# Patient Record
Sex: Female | Born: 1975 | Race: White | Hispanic: No | Smoking: Never smoker
Health system: Southern US, Community
[De-identification: ages and names within clinical notes are randomized; demographics above are authoritative.]

## PROBLEM LIST (undated history)

## (undated) DIAGNOSIS — E119 Type 2 diabetes mellitus without complications: Secondary | ICD-10-CM

## (undated) DIAGNOSIS — A599 Trichomoniasis, unspecified: Secondary | ICD-10-CM

## (undated) DIAGNOSIS — R87810 Cervical high risk human papillomavirus (HPV) DNA test positive: Secondary | ICD-10-CM

## (undated) DIAGNOSIS — Z9889 Other specified postprocedural states: Secondary | ICD-10-CM

## (undated) HISTORY — PX: CHOLECYSTECTOMY, LAPAROSCOPIC: SHX56

## (undated) HISTORY — DX: Cervical high risk human papillomavirus (HPV) DNA test positive: R87.810

## (undated) HISTORY — DX: Trichomoniasis, unspecified: A59.9

## (undated) HISTORY — DX: Other specified postprocedural states: Z98.890

## (undated) HISTORY — PX: APPENDECTOMY: SHX54

---

## 2012-09-06 ENCOUNTER — Other Ambulatory Visit (HOSPITAL_COMMUNITY)
Admission: RE | Admit: 2012-09-06 | Discharge: 2012-09-06 | Disposition: A | Discharge status: Home / Self Care | Payer: Medicaid Other | Source: Ambulatory Visit | Attending: Obstetrics & Gynecology | Admitting: Obstetrics & Gynecology

## 2012-09-06 ENCOUNTER — Ambulatory Visit (INDEPENDENT_AMBULATORY_CARE_PROVIDER_SITE_OTHER): Payer: Medicaid Other | Admitting: Obstetrics & Gynecology

## 2012-09-06 ENCOUNTER — Other Ambulatory Visit: Payer: Self-pay | Admitting: Obstetrics & Gynecology

## 2012-09-06 ENCOUNTER — Encounter: Payer: Self-pay | Admitting: Obstetrics & Gynecology

## 2012-09-06 VITALS — BP 118/81 | HR 75 | Ht 62.0 in | Wt 203.0 lb

## 2012-09-06 DIAGNOSIS — Z01419 Encounter for gynecological examination (general) (routine) without abnormal findings: Secondary | ICD-10-CM

## 2012-09-06 DIAGNOSIS — N912 Amenorrhea, unspecified: Secondary | ICD-10-CM

## 2012-09-06 DIAGNOSIS — Z124 Encounter for screening for malignant neoplasm of cervix: Secondary | ICD-10-CM

## 2012-09-06 DIAGNOSIS — N760 Acute vaginitis: Secondary | ICD-10-CM | POA: Insufficient documentation

## 2012-09-06 DIAGNOSIS — Z113 Encounter for screening for infections with a predominantly sexual mode of transmission: Secondary | ICD-10-CM | POA: Insufficient documentation

## 2012-09-06 DIAGNOSIS — Z1151 Encounter for screening for human papillomavirus (HPV): Secondary | ICD-10-CM | POA: Insufficient documentation

## 2012-09-06 DIAGNOSIS — Z3049 Encounter for surveillance of other contraceptives: Secondary | ICD-10-CM

## 2012-09-06 DIAGNOSIS — N898 Other specified noninflammatory disorders of vagina: Secondary | ICD-10-CM

## 2012-09-06 DIAGNOSIS — N899 Noninflammatory disorder of vagina, unspecified: Secondary | ICD-10-CM

## 2012-09-06 NOTE — Progress Notes (Signed)
Here for yearly exam, she was diagnosed and treated [facility] the health dept last month for trich.  She is feeling better but still has some irritation and lower pelvic pain, she wants to be sure we know she has family planning medicaid so it will only cover yearly exam.  She has not had a period in 6 months, and it was a year before this last one.  She had norplant years ago and cycles have not been right since.  She has not seen a physician in 9 years.

## 2012-09-06 NOTE — Patient Instructions (Signed)

## 2012-09-06 NOTE — Progress Notes (Signed)
  Subjective:     Cindy Webb is a 36 y.o. female here for a routine exam.  Current complaints: mass in mons, ammenorrhea for 6 months to a year for many years, .  Personal health questionnaire reviewed: yes.   Gynecologic History No LMP recorded. Contraception: condoms Last Pap: ?Marland Kitchen Results were: ?  Obstetric History OB History    Grav Para Term Preterm Abortions TAB SAB Ect Mult Living                   The following portions of the patient's history were reviewed and updated as appropriate: allergies, current medications, past family history, past medical history, past social history, past surgical history and problem list.  Review of Systems A comprehensive review of systems was negative except for: fatigue, abdominal pain, dysuria, vaginal itching, dyspaurenia    Objective:    Vitals:  WNL General appearance: alert, cooperative and no distress Head: Normocephalic, without obvious abnormality, atraumatic Eyes: negative Throat: lips, mucosa, and tongue normal; teeth and gums normal Lungs: clear to auscultation bilaterally Breasts: normal appearance, no masses or tenderness, No nipple retraction or dimpling, No nipple discharge or bleeding Heart: regular rate and rhythm Abdomen: soft, non-tender; bowel sounds normal; no masses,  no organomegaly Pelvic: cervix normal in appearance(bled after pap smear), external genitalia normal, no adnexal masses or tenderness (exam limited by habitus), no bladder tenderness, no cervical motion tenderness, perianal skin: no external genital warts noted, urethra without abnormality or discharge, uterus normal size (limited by habitus), shape, and consistency and vagina normal without discharge, 2 cm by 1 cm mobile mass in mons, skin nml above mass, ? Lymph node. Extremities: no edema, redness or tenderness in the calves or thighs Skin: no lesions or rash Lymph nodes: Axillary adenopathy: none        Assessment:    Healthy female exam.  Mass  in mons Oligomenorrhea   Plan:    Education reviewed: safe sex/STD prevention, self breast exams, skin cancer screening and weight bearing exercise. Contraception: condoms. Follow up in: 3 weeks. Pt needs [place] of mons to eval for mass Pt needs endometrial biopsy due to oligomenorrhea Pt would like to apply for charity care prior to starting treatment TOC today for Cindy Webb (partner never treated).

## 2012-09-12 ENCOUNTER — Telehealth: Payer: Self-pay | Admitting: *Deleted

## 2012-09-12 ENCOUNTER — Encounter: Payer: Self-pay | Admitting: *Deleted

## 2012-09-12 ENCOUNTER — Encounter: Payer: Self-pay | Admitting: Obstetrics & Gynecology

## 2012-09-12 DIAGNOSIS — R87810 Cervical high risk human papillomavirus (HPV) DNA test positive: Secondary | ICD-10-CM

## 2012-09-12 HISTORY — DX: Cervical high risk human papillomavirus (HPV) DNA test positive: R87.810

## 2012-09-12 NOTE — Telephone Encounter (Signed)
Pt notified via mail that her pap was normal but she was positive for HPV and repeat pap in 1 year.  Information about HPV mailed along with the letter.

## 2013-10-17 ENCOUNTER — Other Ambulatory Visit (HOSPITAL_COMMUNITY)
Admission: RE | Admit: 2013-10-17 | Discharge: 2013-10-17 | Disposition: A | Discharge status: Home / Self Care | Payer: Medicaid Other | Source: Ambulatory Visit | Attending: Obstetrics & Gynecology | Admitting: Obstetrics & Gynecology

## 2013-10-17 ENCOUNTER — Ambulatory Visit (INDEPENDENT_AMBULATORY_CARE_PROVIDER_SITE_OTHER): Payer: Medicaid Other | Admitting: Obstetrics & Gynecology

## 2013-10-17 ENCOUNTER — Encounter: Payer: Self-pay | Admitting: Obstetrics & Gynecology

## 2013-10-17 VITALS — BP 131/90 | HR 94 | Resp 16 | Ht 62.0 in | Wt 208.0 lb

## 2013-10-17 DIAGNOSIS — N912 Amenorrhea, unspecified: Secondary | ICD-10-CM

## 2013-10-17 DIAGNOSIS — Z113 Encounter for screening for infections with a predominantly sexual mode of transmission: Secondary | ICD-10-CM | POA: Insufficient documentation

## 2013-10-17 DIAGNOSIS — N760 Acute vaginitis: Secondary | ICD-10-CM | POA: Insufficient documentation

## 2013-10-17 DIAGNOSIS — Z01419 Encounter for gynecological examination (general) (routine) without abnormal findings: Secondary | ICD-10-CM

## 2013-10-17 DIAGNOSIS — Z23 Encounter for immunization: Secondary | ICD-10-CM

## 2013-10-17 LAB — POCT URINE PREGNANCY: Preg Test, Ur: NEGATIVE

## 2013-10-17 NOTE — Progress Notes (Signed)
  Subjective:     Cindy Webb is a 37 y.o. female here for a routine exam.  Current complaints: no menses for at least 7 months.  Did not hear back after turning in her indigentt care paperwork so she did not come back to the MD.  Having unprotected sex.  Has some vaginal burning which she thinks is yeast .  Personal health questionnaire reviewed: yes.   Gynecologic History No LMP recorded. Contraception: none Last Pap: 2013. Results were: cytology nml with +HPV Last mammogram: n/a.   Obstetric History OB History  Gravida Para Term Preterm AB SAB TAB Ectopic Multiple Living  0 0 0 0 0 0 0 0 0 0          The following portions of the patient's history were reviewed and updated as appropriate: allergies, current medications, past family history, past medical history, past social history, past surgical history and problem list.  Review of Systems Pertinent items are noted in HPI.    Objective:      Filed Vitals:   10/17/13 1359  BP: 131/90  Pulse: 94  Resp: 16  Height: 5\' 2"  (1.575 m)  Weight: 208 lb (94.348 kg)   Vitals:  WNL General appearance: alert, cooperative and no distress Head: Normocephalic, without obvious abnormality, atraumatic Eyes: negative Throat: lips, mucosa, and tongue normal; teeth and gums normal Lungs: clear to auscultation bilaterally Breasts: normal appearance, no masses or tenderness, No nipple retraction or dimpling, No nipple discharge or bleeding Heart: regular rate and rhythm Abdomen: soft, non-tender; bowel sounds normal; no masses,  no organomegaly Pelvic: cervix normal in appearance, external genitalia -- has 1 cm mobile mas in mons--no change from last yearl, no adnexal masses or tenderness, no bladder tenderness, no cervical motion tenderness, perianal skin: no external genital warts noted, urethra without abnormality or discharge, uterus non tender but difficult to assess due to habitus, and vagina normal without discharge Extremities: no  edema, redness or tenderness in the calves or thighs Skin: no lesions or rash Lymph nodes: Axillary adenopathy: none         Assessment:  And Plan   Healthy female exam.  Pap with HPV today BD affirm GC/Chlam testing Needs endometrial biopsy due to amenorrhea TSH Prolactin UPT   Plan:

## 2013-10-18 LAB — GC/CHLAMYDIA PROBE AMP
CT Probe RNA: NEGATIVE
GC Probe RNA: NEGATIVE

## 2013-10-18 LAB — TSH: TSH: 2.018 u[IU]/mL (ref 0.350–4.500)

## 2013-10-22 ENCOUNTER — Encounter: Payer: Self-pay | Admitting: Obstetrics & Gynecology

## 2013-10-22 ENCOUNTER — Telehealth: Payer: Self-pay | Admitting: *Deleted

## 2013-10-22 NOTE — Telephone Encounter (Signed)
Called pt to adv needs Colpo due to positive HPV - she will make appt to come in for that.

## 2013-10-23 ENCOUNTER — Telehealth: Payer: Self-pay | Admitting: *Deleted

## 2013-10-23 NOTE — Telephone Encounter (Signed)
Copy of normal labs mailed to pt's home address per pt's request. 

## 2017-07-25 ENCOUNTER — Other Ambulatory Visit (HOSPITAL_COMMUNITY)
Admission: RE | Admit: 2017-07-25 | Discharge: 2017-07-25 | Disposition: A | Discharge status: Home / Self Care | Payer: Medicaid Other | Source: Ambulatory Visit | Attending: Family Medicine | Admitting: Family Medicine

## 2017-07-25 ENCOUNTER — Encounter: Payer: Self-pay | Admitting: Family Medicine

## 2017-07-25 ENCOUNTER — Ambulatory Visit (INDEPENDENT_AMBULATORY_CARE_PROVIDER_SITE_OTHER): Payer: Medicaid Other | Admitting: Family Medicine

## 2017-07-25 VITALS — BP 127/77 | HR 72 | Ht 62.0 in | Wt 196.0 lb

## 2017-07-25 DIAGNOSIS — N939 Abnormal uterine and vaginal bleeding, unspecified: Secondary | ICD-10-CM

## 2017-07-25 DIAGNOSIS — Z3042 Encounter for surveillance of injectable contraceptive: Secondary | ICD-10-CM

## 2017-07-25 DIAGNOSIS — Z113 Encounter for screening for infections with a predominantly sexual mode of transmission: Secondary | ICD-10-CM | POA: Insufficient documentation

## 2017-07-25 DIAGNOSIS — N760 Acute vaginitis: Secondary | ICD-10-CM | POA: Insufficient documentation

## 2017-07-25 DIAGNOSIS — R87610 Atypical squamous cells of undetermined significance on cytologic smear of cervix (ASC-US): Secondary | ICD-10-CM | POA: Diagnosis not present

## 2017-07-25 DIAGNOSIS — B9689 Other specified bacterial agents as the cause of diseases classified elsewhere: Secondary | ICD-10-CM | POA: Insufficient documentation

## 2017-07-25 DIAGNOSIS — Z1239 Encounter for other screening for malignant neoplasm of breast: Secondary | ICD-10-CM

## 2017-07-25 MED ORDER — MEDROXYPROGESTERONE ACETATE 150 MG/ML IM SUSP: 150.0000 mg | INTRAMUSCULAR | 3 refills | Status: DC

## 2017-07-25 NOTE — Progress Notes (Signed)
   Subjective:    Patient ID: Cindy Webb is a 41 y.o. female presenting with Discuss Menstrual Cycle  on 07/25/2017  HPI: Reports 2 wks of bleeding and has been spotting. Is not heavy. chcles have been off. Had a child 1 year ago and bled a lot. Gall bladder surgery on 7/16 and bleeding since then. Bleeding is worse with intercourse. Has also had some thin watery discharge. Last period before that was April. On Depo Provera, last given 05/10/17. Lump on mons which is not bothering her.  Review of Systems  Constitutional: Negative for chills and fever.  HENT: Negative for congestion, nosebleeds and rhinorrhea.   Eyes: Negative for visual disturbance.  Respiratory: Negative for chest tightness and shortness of breath.   Cardiovascular: Negative for chest pain.  Gastrointestinal: Negative for abdominal distention, abdominal pain, blood in stool, constipation, diarrhea, nausea and vomiting.  Genitourinary: Positive for menstrual problem. Negative for dysuria, frequency, pelvic pain and vaginal bleeding.  Musculoskeletal: Negative for arthralgias.  Skin: Negative for rash.  Neurological: Negative for dizziness and headaches.  Psychiatric/Behavioral: Negative for behavioral problems, dysphoric mood and sleep disturbance.  All other systems reviewed and are negative.     Objective:    BP 127/77   Pulse 72   Ht 5\' 2"  (1.575 m)   Wt 196 lb (88.9 kg)   BMI 35.85 kg/m  Physical Exam  Constitutional: She is oriented to person, place, and time. She appears well-developed and well-nourished. No distress.  HENT:  Head: Normocephalic and atraumatic.  Eyes: Pupils are equal, round, and reactive to light. No scleral icterus.  Neck: Normal range of motion. Neck supple. No thyromegaly present.  Cardiovascular: Normal rate, regular rhythm and intact distal pulses.   Pulmonary/Chest: Effort normal and breath sounds normal. Right breast exhibits no inverted nipple, no mass and no skin change. Left  breast exhibits no inverted nipple, no mass and no skin change. Breasts are symmetrical.  Abdominal: Soft. She exhibits no distension. There is no tenderness.  Genitourinary:  Genitourinary Comments: BUS normal, vagina is pink and ruggated, dark blood noted with foul-smelling odor, cervix is nulliparous without lesion, uterus is small and anteverted, no adnexal mass or tenderness.   Neurological: She is alert and oriented to person, place, and time.  Skin: Skin is warm and dry.  Psychiatric: She has a normal mood and affect.        Assessment & Plan:   Problem List Items Addressed This Visit      Unprioritized   Abnormal uterine bleeding - Primary    Likely related to Depo--check cultures and treat appropriately. Check TSH. If no better after next Depo--consider changing contraceptives. Patinet is not interested in having more kids.      Relevant Orders   Cervicovaginal ancillary only   TSH    Other Visit Diagnoses    Screen for STD (sexually transmitted disease)       Relevant Orders   Cytology - PAP   Screening for breast cancer       Relevant Orders   MM DIGITAL SCREENING BILATERAL   Encounter for surveillance of injectable contraceptive       Relevant Medications   medroxyPROGESTERone (DEPO-PROVERA) 150 MG/ML injection      Total face-to-face time with patient: 30 minutes. Over 50% of encounter was spent on counseling and coordination of care. Return in about 3 months (around 10/25/2017) for a follow-up.  Reva Boresanya S Elea Holtzclaw 07/25/2017 1:01 PM

## 2017-07-25 NOTE — Assessment & Plan Note (Signed)
Likely related to Depo--check cultures and treat appropriately. Check TSH. If no better after next Depo--consider changing contraceptives. Patinet is not interested in having more kids.

## 2017-07-25 NOTE — Patient Instructions (Signed)
Preventive Care 18-39 Years, Female Preventive care refers to lifestyle choices and visits with your health care provider that can promote health and wellness. What does preventive care include?  A yearly physical exam. This is also called an annual well check.  Dental exams once or twice a year.  Routine eye exams. Ask your health care provider how often you should have your eyes checked.  Personal lifestyle choices, including: ? Daily care of your teeth and gums. ? Regular physical activity. ? Eating a healthy diet. ? Avoiding tobacco and drug use. ? Limiting alcohol use. ? Practicing safe sex. ? Taking vitamin and mineral supplements as recommended by your health care provider. What happens during an annual well check? The services and screenings done by your health care provider during your annual well check will depend on your age, overall health, lifestyle risk factors, and family history of disease. Counseling Your health care provider may ask you questions about your:  Alcohol use.  Tobacco use.  Drug use.  Emotional well-being.  Home and relationship well-being.  Sexual activity.  Eating habits.  Work and work Statistician.  Method of birth control.  Menstrual cycle.  Pregnancy history.  Screening You may have the following tests or measurements:  Height, weight, and BMI.  Diabetes screening. This is done by checking your blood sugar (glucose) after you have not eaten for a while (fasting).  Blood pressure.  Lipid and cholesterol levels. These may be checked every 5 years starting at age 66.  Skin check.  Hepatitis C blood test.  Hepatitis B blood test.  Sexually transmitted disease (STD) testing.  BRCA-related cancer screening. This may be done if you have a family history of breast, ovarian, tubal, or peritoneal cancers.  Pelvic exam and Pap test. This may be done every 3 years starting at age 40. Starting at age 59, this may be done every 5  years if you have a Pap test in combination with an HPV test.  Discuss your test results, treatment options, and if necessary, the need for more tests with your health care provider. Vaccines Your health care provider may recommend certain vaccines, such as:  Influenza vaccine. This is recommended every year.  Tetanus, diphtheria, and acellular pertussis (Tdap, Td) vaccine. You may need a Td booster every 10 years.  Varicella vaccine. You may need this if you have not been vaccinated.  HPV vaccine. If you are 69 or younger, you may need three doses over 6 months.  Measles, mumps, and rubella (MMR) vaccine. You may need at least one dose of MMR. You may also need a second dose.  Pneumococcal 13-valent conjugate (PCV13) vaccine. You may need this if you have certain conditions and were not previously vaccinated.  Pneumococcal polysaccharide (PPSV23) vaccine. You may need one or two doses if you smoke cigarettes or if you have certain conditions.  Meningococcal vaccine. One dose is recommended if you are age 27-21 years and a first-year college student living in a residence hall, or if you have one of several medical conditions. You may also need additional booster doses.  Hepatitis A vaccine. You may need this if you have certain conditions or if you travel or work in places where you may be exposed to hepatitis A.  Hepatitis B vaccine. You may need this if you have certain conditions or if you travel or work in places where you may be exposed to hepatitis B.  Haemophilus influenzae type b (Hib) vaccine. You may need this if  you have certain risk factors.  Talk to your health care provider about which screenings and vaccines you need and how often you need them. This information is not intended to replace advice given to you by your health care provider. Make sure you discuss any questions you have with your health care provider. Document Released: 01/31/2002 Document Revised: 08/24/2016  Document Reviewed: 10/06/2015 Elsevier Interactive Patient Education  2017 [employer].

## 2017-07-26 LAB — TSH: TSH: 1.38 m[IU]/L

## 2017-07-27 LAB — CERVICOVAGINAL ANCILLARY ONLY
Bacterial vaginitis: POSITIVE — AB
Candida vaginitis: NEGATIVE
Chlamydia: NEGATIVE
Neisseria Gonorrhea: NEGATIVE
Trichomonas: NEGATIVE

## 2017-07-28 ENCOUNTER — Telehealth: Payer: Self-pay

## 2017-07-28 MED ORDER — METRONIDAZOLE 500 MG PO TABS: 500.0000 mg | ORAL_TABLET | Freq: Two times a day (BID) | ORAL | 0 refills | Status: DC

## 2017-07-28 NOTE — Telephone Encounter (Signed)
-----   Message from Reva Boresanya S Pratt, MD sent at 07/28/2017 10:17 AM EDT ----- Please treat for BV--flagyl 500 mg po bid x 7 d

## 2017-07-28 NOTE — Telephone Encounter (Signed)
Patient called and made aware that she has bacterial vaginosis. Patient made aware that she will need to take antibiotic twice a day for seven days. Armandina StammerJennifer Stuart Mirabile RNBSN

## 2017-07-31 LAB — CYTOLOGY - PAP
Diagnosis: UNDETERMINED — AB
HPV 16/18/45 GENOTYPING: NEGATIVE
HPV: DETECTED — AB

## 2017-08-02 ENCOUNTER — Telehealth: Payer: Self-pay | Admitting: *Deleted

## 2017-08-02 ENCOUNTER — Ambulatory Visit (INDEPENDENT_AMBULATORY_CARE_PROVIDER_SITE_OTHER): Payer: Medicaid Other

## 2017-08-02 DIAGNOSIS — Z1231 Encounter for screening mammogram for malignant neoplasm of breast: Secondary | ICD-10-CM

## 2017-08-02 DIAGNOSIS — Z1239 Encounter for other screening for malignant neoplasm of breast: Secondary | ICD-10-CM

## 2017-08-02 NOTE — Telephone Encounter (Signed)
-----   Message from Reva Boresanya S Pratt, MD sent at 08/01/2017  9:49 AM EDT ----- Abnormal pap, needs colpo

## 2017-08-02 NOTE — Telephone Encounter (Signed)
Pt aware of pap results and needs to schedule a colposcopy.  She is currently bleeding and will give us an update next week when she comes for her Depo Provera injection.

## 2017-08-10 ENCOUNTER — Ambulatory Visit (INDEPENDENT_AMBULATORY_CARE_PROVIDER_SITE_OTHER): Payer: Medicaid Other | Admitting: *Deleted

## 2017-08-10 DIAGNOSIS — Z3042 Encounter for surveillance of injectable contraceptive: Secondary | ICD-10-CM | POA: Diagnosis not present

## 2017-08-10 DIAGNOSIS — Z30019 Encounter for initial prescription of contraceptives, unspecified: Secondary | ICD-10-CM

## 2017-08-10 MED ORDER — MEDROXYPROGESTERONE ACETATE 150 MG/ML IM SUSP
150.0000 mg | INTRAMUSCULAR | Status: AC
  Administered 2017-08-10: 150 mg via INTRAMUSCULAR

## 2017-08-10 NOTE — Progress Notes (Signed)
Pt here for Depo Provera injection.  She states that she is still having some bleeding most days since her Gallbladder surgery 07/03/17.  She needs a colposcopy but is bleeding.  She will call me in 1 week with update on bleeding to schedule her Colpo.  If still bleeding will make appt with provider.

## 2017-08-29 ENCOUNTER — Encounter: Payer: Self-pay | Admitting: Obstetrics and Gynecology

## 2017-08-29 ENCOUNTER — Ambulatory Visit (INDEPENDENT_AMBULATORY_CARE_PROVIDER_SITE_OTHER): Payer: Medicaid Other | Admitting: Obstetrics and Gynecology

## 2017-08-29 ENCOUNTER — Other Ambulatory Visit (HOSPITAL_COMMUNITY)
Admission: RE | Admit: 2017-08-29 | Discharge: 2017-08-29 | Disposition: A | Discharge status: Home / Self Care | Payer: Medicaid Other | Source: Ambulatory Visit | Attending: Obstetrics and Gynecology | Admitting: Obstetrics and Gynecology

## 2017-08-29 VITALS — BP 135/88 | HR 86 | Ht 62.0 in | Wt 197.0 lb

## 2017-08-29 DIAGNOSIS — R87610 Atypical squamous cells of undetermined significance on cytologic smear of cervix (ASC-US): Secondary | ICD-10-CM

## 2017-08-29 DIAGNOSIS — Z01812 Encounter for preprocedural laboratory examination: Secondary | ICD-10-CM

## 2017-08-29 LAB — POCT URINE PREGNANCY: PREG TEST UR: NEGATIVE

## 2017-08-29 NOTE — Progress Notes (Signed)
41 yo here for colposcopy. 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  Placed in lithotomy position. Cervix viewed with speculum and colposcope after application of acetic acid.   Colposcopy adequate?  Yes   Acetowhite lesions? Yes at 1 an 8 o'clock Punctation? no Mosaicism?  no Abnormal vasculature?  no Biopsies? Yes at 1 an 8 o'clock ECC? yes  COMMENTS:  Patient was given post procedure instructions.  She will return in 2 weeks for results.  Catalina AntiguaPeggy Atari Novick, MD

## 2017-09-04 ENCOUNTER — Telehealth: Payer: Self-pay | Admitting: *Deleted

## 2017-09-04 NOTE — Telephone Encounter (Signed)
Pt notified of colpo results and she is to return in 1 year for a pap smear.

## 2017-09-04 NOTE — Telephone Encounter (Signed)
-----   Message from Catalina Antigua, MD sent at 09/02/2017  9:16 AM EDT ----- Please inform patient of colpo biopsies consistent with pap smear. Plan is to repeat pap smear in 1 year. In the meantime, lifestyle changes to help improve immune system may be beneficial such as exercising regularly, eating healthy fruits and vegetables, sleeping at least 7 hours, minimize stress and taking a B complex supplement  Thanks  [place]

## 2017-11-02 ENCOUNTER — Ambulatory Visit (INDEPENDENT_AMBULATORY_CARE_PROVIDER_SITE_OTHER): Payer: Medicaid Other

## 2017-11-02 DIAGNOSIS — Z3042 Encounter for surveillance of injectable contraceptive: Secondary | ICD-10-CM

## 2017-11-02 DIAGNOSIS — Z23 Encounter for immunization: Secondary | ICD-10-CM | POA: Diagnosis not present

## 2017-11-02 DIAGNOSIS — Z309 Encounter for contraceptive management, unspecified: Secondary | ICD-10-CM

## 2017-11-02 MED ORDER — MEDROXYPROGESTERONE ACETATE 150 MG/ML IM SUSP
150.0000 mg | Freq: Once | INTRAMUSCULAR | Status: AC
  Administered 2017-11-02: 150 mg via INTRAMUSCULAR

## 2018-01-11 ENCOUNTER — Telehealth: Payer: Self-pay

## 2018-01-11 DIAGNOSIS — Z3042 Encounter for surveillance of injectable contraceptive: Secondary | ICD-10-CM

## 2018-01-11 MED ORDER — MEDROXYPROGESTERONE ACETATE 150 MG/ML IM SUSP: 150.0000 mg | INTRAMUSCULAR | 3 refills | Status: DC

## 2018-01-11 NOTE — Telephone Encounter (Signed)
Pt called needing a refill on her Depo. Refilled and sent to pharmacy.

## 2018-01-24 ENCOUNTER — Ambulatory Visit (INDEPENDENT_AMBULATORY_CARE_PROVIDER_SITE_OTHER): Payer: Medicaid Other

## 2018-01-24 DIAGNOSIS — Z3042 Encounter for surveillance of injectable contraceptive: Secondary | ICD-10-CM

## 2018-01-24 MED ORDER — MEDROXYPROGESTERONE ACETATE 150 MG/ML IM SUSP
150.0000 mg | Freq: Once | INTRAMUSCULAR | Status: AC
  Administered 2018-01-24: 150 mg via INTRAMUSCULAR

## 2018-04-23 ENCOUNTER — Other Ambulatory Visit: Payer: Medicaid Other

## 2018-04-23 DIAGNOSIS — Z3042 Encounter for surveillance of injectable contraceptive: Secondary | ICD-10-CM | POA: Diagnosis not present

## 2018-04-23 DIAGNOSIS — IMO0001 Reserved for inherently not codable concepts without codable children: Secondary | ICD-10-CM

## 2018-04-23 MED ORDER — MEDROXYPROGESTERONE ACETATE 150 MG/ML IM SUSP
150.0000 mg | Freq: Once | INTRAMUSCULAR | Status: AC
  Administered 2018-04-23: 150 mg via INTRAMUSCULAR

## 2018-07-24 ENCOUNTER — Ambulatory Visit: Payer: Medicaid Other

## 2018-07-24 DIAGNOSIS — IMO0001 Reserved for inherently not codable concepts without codable children: Secondary | ICD-10-CM

## 2018-07-24 DIAGNOSIS — Z3042 Encounter for surveillance of injectable contraceptive: Secondary | ICD-10-CM

## 2018-07-24 MED ORDER — MEDROXYPROGESTERONE ACETATE 150 MG/ML IM SUSP
150.0000 mg | Freq: Once | INTRAMUSCULAR | Status: AC
  Administered 2018-07-24: 150 mg via INTRAMUSCULAR

## 2018-09-26 ENCOUNTER — Encounter: Payer: Self-pay | Admitting: Obstetrics and Gynecology

## 2018-09-26 ENCOUNTER — Ambulatory Visit: Payer: Medicaid Other | Admitting: Obstetrics and Gynecology

## 2018-09-26 ENCOUNTER — Ambulatory Visit (INDEPENDENT_AMBULATORY_CARE_PROVIDER_SITE_OTHER): Payer: Medicaid Other

## 2018-09-26 ENCOUNTER — Other Ambulatory Visit (HOSPITAL_COMMUNITY)
Admission: RE | Admit: 2018-09-26 | Discharge: 2018-09-26 | Disposition: A | Discharge status: Home / Self Care | Payer: Medicaid Other | Source: Ambulatory Visit | Attending: Obstetrics and Gynecology | Admitting: Obstetrics and Gynecology

## 2018-09-26 ENCOUNTER — Telehealth: Payer: Self-pay

## 2018-09-26 VITALS — BP 116/80 | HR 73 | Ht 62.0 in | Wt 188.0 lb

## 2018-09-26 DIAGNOSIS — Z124 Encounter for screening for malignant neoplasm of cervix: Secondary | ICD-10-CM | POA: Insufficient documentation

## 2018-09-26 DIAGNOSIS — N938 Other specified abnormal uterine and vaginal bleeding: Secondary | ICD-10-CM | POA: Diagnosis present

## 2018-09-26 MED ORDER — MEGESTROL ACETATE 40 MG PO TABS: 40.0000 mg | ORAL_TABLET | Freq: Two times a day (BID) | ORAL | 5 refills | Status: DC

## 2018-09-26 NOTE — Patient Instructions (Signed)

## 2018-09-26 NOTE — Telephone Encounter (Addendum)
Spoke with pt and she is aware of U/S results and to keep scheduled appt.  ----- Message from Catalina Antigua, MD sent at 09/26/2018  2:11 PM EDT ----- Please inform patient of normal ultrasound without fibroid or ovarian cyst. She should keep her appointment as scheduled to discuss results of endometrial biopsy and further management options

## 2018-09-26 NOTE — Progress Notes (Signed)
42 yo P1 with BMI 34 here for the evaluation of vaginal bleeding for the past 3 weeks. Patient is currently on depo-provera for contraception and cycle control. Patient reports a monthly period lasting 8-9 days typically but never 3 weeks long. She reports some dysmenorrhea. Patient also reports persistent vaginal irritation/pruritis despite several treatment for vaginal yeast infection. She was recently treated for a UTI. Patient reports normal mammogram this year and is due for a pap smear.  Past Medical History:  Diagnosis Date  . H/O colposcopy with cervical biopsy   . Hypertension    pregnancy infection  . Trichomonal infection    Past Surgical History:  Procedure Laterality Date  . APPENDECTOMY    . CESAREAN SECTION    . CHOLECYSTECTOMY, LAPAROSCOPIC     Family History  Problem Relation Age of Onset  . Diabetes Mother   . Diabetes Maternal Aunt    Social History   Tobacco Use  . Smoking status: Never Smoker  . Smokeless tobacco: Never Used  Substance Use Topics  . Alcohol use: No  . Drug use: No   ROS See pertinent in HPI Blood pressure 116/80, pulse 73, height 5\' 2"  (1.575 m), weight 188 lb (85.3 kg), last menstrual period 09/12/2018.  GENERAL: Well-developed, well-nourished female in no acute distress.  HEENT: Normocephalic, atraumatic. Sclerae anicteric.  NECK: Supple. Normal thyroid.  LUNGS: Clear to auscultation bilaterally.  HEART: Regular rate and rhythm. BREASTS: Symmetric in size. No palpable masses or lymphadenopathy, skin changes, or nipple drainage. ABDOMEN: Soft, nontender, nondistended. No organomegaly. PELVIC: Normal external female genitalia. Vagina is pink and rugated.  Normal discharge. Normal appearing cervix. Uterus is normal in size. No adnexal mass or tenderness. EXTREMITIES: No cyanosis, clubbing, or edema, 2+ distal pulses.  A/P 42 yo with DUB - pap smear collected  - pelvic ultrasound ordered - rx megace provided to assist with current  DUB - Discussed Mirena IUD as a possible treatment options - endometrial biopsy performed ENDOMETRIAL BIOPSY     The indications for endometrial biopsy were reviewed.   Risks of the biopsy including cramping, bleeding, infection, uterine perforation, inadequate specimen and need for additional procedures  were discussed. The patient states she understands and agrees to undergo procedure today. Consent was signed. Time out was performed. Urine HCG was negative. A sterile speculum was placed in the patient's vagina and the cervix was prepped with Betadine. A single-toothed tenaculum was placed on the anterior lip of the cervix to stabilize it. The uterine cavity was sounded to a depth of 8 cm using the uterine sound. The 3 mm pipelle was introduced into the endometrial cavity without difficulty, 2 passes were made.  A  moderate amount of tissue was  sent to pathology. The instruments were removed from the patient's vagina. Minimal bleeding from the cervix was noted. The patient tolerated the procedure well.  Routine post-procedure instructions were given to the patient. The patient will follow up in two weeks to review the results and for further management.

## 2018-09-27 ENCOUNTER — Telehealth: Payer: Self-pay | Admitting: *Deleted

## 2018-09-27 NOTE — Telephone Encounter (Signed)
-----   Message from Catalina Antigua, MD sent at 09/26/2018  2:11 PM EDT ----- Please inform patient of normal ultrasound without fibroid or ovarian cyst. She should keep her appointment as scheduled to discuss results of endometrial biopsy and further management options

## 2018-09-27 NOTE — Telephone Encounter (Signed)
LM on cell phone that her U/S was normal and that she needed to keep her appt to discuss further treatment.

## 2018-10-02 ENCOUNTER — Telehealth: Payer: Self-pay | Admitting: *Deleted

## 2018-10-02 LAB — CYTOLOGY - PAP
Bacterial vaginitis: POSITIVE — AB
Candida vaginitis: NEGATIVE
DIAGNOSIS: NEGATIVE
DIAGNOSIS: REACTIVE
HPV 16/18/45 GENOTYPING: NEGATIVE
HPV: DETECTED — AB

## 2018-10-02 MED ORDER — METRONIDAZOLE 500 MG PO TABS: 500.0000 mg | ORAL_TABLET | Freq: Two times a day (BID) | ORAL | 0 refills | Status: DC

## 2018-10-02 NOTE — Telephone Encounter (Signed)
-----   Message from Catalina Antigua, MD sent at 10/02/2018  3:48 PM EDT ----- Please inform patient of BV infection. Rx has been e-prescribed.

## 2018-10-02 NOTE — Addendum Note (Signed)
Addended by: Catalina Antigua on: 10/02/2018 03:49 PM   Modules accepted: Orders

## 2018-10-02 NOTE — Telephone Encounter (Signed)
Pt notified that she is positive for BV and Dr Jolayne Panther sent a RX into [facility] for her.

## 2018-10-15 ENCOUNTER — Ambulatory Visit: Payer: Medicaid Other | Admitting: Obstetrics & Gynecology

## 2018-10-15 ENCOUNTER — Encounter (HOSPITAL_COMMUNITY): Payer: Self-pay

## 2018-10-15 ENCOUNTER — Encounter: Payer: Self-pay | Admitting: Obstetrics & Gynecology

## 2018-10-15 VITALS — BP 123/89 | HR 82 | Ht 62.0 in | Wt 187.0 lb

## 2018-10-15 DIAGNOSIS — Z23 Encounter for immunization: Secondary | ICD-10-CM | POA: Diagnosis not present

## 2018-10-15 DIAGNOSIS — N938 Other specified abnormal uterine and vaginal bleeding: Secondary | ICD-10-CM

## 2018-10-15 DIAGNOSIS — Z3042 Encounter for surveillance of injectable contraceptive: Secondary | ICD-10-CM

## 2018-10-15 MED ORDER — MEDROXYPROGESTERONE ACETATE 150 MG/ML IM SUSP: 150.0000 mg | INTRAMUSCULAR | 3 refills | Status: DC

## 2018-10-15 NOTE — Progress Notes (Signed)
   Subjective:    Patient ID: Cindy Webb, female    DOB: [birth year], 42 y.o.   MRN: [id number]  HPI 42 yo single P26 (2 yo daughter) here today to follow up after u/s and EMBX for DUB on depo provera. All the tests were normal. She has tried Norplant, OCPs in the past and still had irregular bleeding. The bleeding got better with the megace. She likes the depo provera the best of all the options she has tried. She would like a BTL as she is certain that she does not want more kids.  She also reports dysparueunia for about 6-7 months. This is like "cramp" pain, with deep thrusting, no problems with lubrications.   Review of Systems Monogamous for 14 years.    Objective:   Physical Exam Breathing, conversing, and ambulating normally Well nourished, well hydrated White female, no apparent distress Abd- benign     Assessment & Plan:  Preventative care- TDAP and flu vaccines Contraception- plan for BTL. Will sign MCD forms today Will get next depo provera shot next month Check TSH today

## 2018-10-16 LAB — TSH: TSH: 1.96 m[IU]/L

## 2018-10-24 ENCOUNTER — Ambulatory Visit: Payer: Medicaid Other

## 2018-10-24 DIAGNOSIS — Z3042 Encounter for surveillance of injectable contraceptive: Secondary | ICD-10-CM

## 2018-10-24 MED ORDER — MEDROXYPROGESTERONE ACETATE 150 MG/ML IM SUSP
150.0000 mg | Freq: Once | INTRAMUSCULAR | Status: AC
  Administered 2018-10-24: 150 mg via INTRAMUSCULAR

## 2018-10-24 NOTE — Progress Notes (Signed)
Pt here for Depo- Provera injection. Pt provided meds. Pt tolerated injection well.

## 2018-11-28 ENCOUNTER — Encounter (HOSPITAL_BASED_OUTPATIENT_CLINIC_OR_DEPARTMENT_OTHER): Payer: Self-pay | Admitting: *Deleted

## 2018-11-28 ENCOUNTER — Other Ambulatory Visit: Payer: Self-pay

## 2018-12-04 ENCOUNTER — Encounter (HOSPITAL_BASED_OUTPATIENT_CLINIC_OR_DEPARTMENT_OTHER)
Admission: RE | Admit: 2018-12-04 | Discharge: 2018-12-04 | Disposition: A | Discharge status: Home / Self Care | Payer: Medicaid Other | Source: Ambulatory Visit | Attending: Obstetrics & Gynecology | Admitting: Obstetrics & Gynecology

## 2018-12-04 DIAGNOSIS — E119 Type 2 diabetes mellitus without complications: Secondary | ICD-10-CM | POA: Diagnosis not present

## 2018-12-04 DIAGNOSIS — Z6835 Body mass index (BMI) 35.0-35.9, adult: Secondary | ICD-10-CM | POA: Diagnosis not present

## 2018-12-04 DIAGNOSIS — Z01818 Encounter for other preprocedural examination: Secondary | ICD-10-CM | POA: Insufficient documentation

## 2018-12-04 DIAGNOSIS — Z79899 Other long term (current) drug therapy: Secondary | ICD-10-CM | POA: Diagnosis not present

## 2018-12-04 DIAGNOSIS — Z7984 Long term (current) use of oral hypoglycemic drugs: Secondary | ICD-10-CM | POA: Diagnosis not present

## 2018-12-04 DIAGNOSIS — Z302 Encounter for sterilization: Secondary | ICD-10-CM | POA: Diagnosis present

## 2018-12-04 LAB — BASIC METABOLIC PANEL
Anion gap: 11 (ref 5–15)
BUN: 10 mg/dL (ref 6–20)
CALCIUM: 9.1 mg/dL (ref 8.9–10.3)
CO2: 21 mmol/L — AB (ref 22–32)
Chloride: 106 mmol/L (ref 98–111)
Creatinine, Ser: 0.69 mg/dL (ref 0.44–1.00)
GFR calc Af Amer: >60 mL/min (ref >60)
GLUCOSE: 211 mg/dL — AB (ref 70–99)
Potassium: 4 mmol/L (ref 3.5–5.1)
Sodium: 138 mmol/L (ref 135–145)

## 2018-12-04 LAB — POCT PREGNANCY, URINE: Preg Test, Ur: NEGATIVE

## 2018-12-04 NOTE — Progress Notes (Signed)
Ensure pre surgery drink given with instructions to complete by 0915 dos, pt verbalized understanding. 

## 2018-12-05 ENCOUNTER — Ambulatory Visit (HOSPITAL_BASED_OUTPATIENT_CLINIC_OR_DEPARTMENT_OTHER): Payer: Medicaid Other | Admitting: Anesthesiology

## 2018-12-05 ENCOUNTER — Ambulatory Visit (HOSPITAL_BASED_OUTPATIENT_CLINIC_OR_DEPARTMENT_OTHER)
Admission: RE | Admit: 2018-12-05 | Discharge: 2018-12-05 | Disposition: A | Discharge status: Home / Self Care | Payer: Medicaid Other | Attending: Obstetrics & Gynecology | Admitting: Obstetrics & Gynecology

## 2018-12-05 ENCOUNTER — Encounter (HOSPITAL_COMMUNITY): Payer: Self-pay

## 2018-12-05 ENCOUNTER — Encounter (HOSPITAL_BASED_OUTPATIENT_CLINIC_OR_DEPARTMENT_OTHER): Payer: Self-pay | Admitting: *Deleted

## 2018-12-05 ENCOUNTER — Other Ambulatory Visit: Payer: Self-pay

## 2018-12-05 ENCOUNTER — Encounter (HOSPITAL_BASED_OUTPATIENT_CLINIC_OR_DEPARTMENT_OTHER)
Admission: RE | Disposition: A | Discharge status: Home / Self Care | Payer: Self-pay | Source: Home / Self Care | Attending: Obstetrics & Gynecology

## 2018-12-05 DIAGNOSIS — E119 Type 2 diabetes mellitus without complications: Secondary | ICD-10-CM | POA: Diagnosis not present

## 2018-12-05 DIAGNOSIS — Z7984 Long term (current) use of oral hypoglycemic drugs: Secondary | ICD-10-CM | POA: Diagnosis not present

## 2018-12-05 DIAGNOSIS — Z79899 Other long term (current) drug therapy: Secondary | ICD-10-CM | POA: Diagnosis not present

## 2018-12-05 DIAGNOSIS — Z302 Encounter for sterilization: Secondary | ICD-10-CM | POA: Diagnosis not present

## 2018-12-05 DIAGNOSIS — Z6835 Body mass index (BMI) 35.0-35.9, adult: Secondary | ICD-10-CM | POA: Insufficient documentation

## 2018-12-05 HISTORY — PX: LAPAROSCOPIC TUBAL LIGATION: SHX1937

## 2018-12-05 HISTORY — DX: Type 2 diabetes mellitus without complications: E11.9

## 2018-12-05 LAB — GLUCOSE, CAPILLARY
GLUCOSE-CAPILLARY: 98 mg/dL (ref 70–99)
Glucose-Capillary: 127 mg/dL — ABNORMAL HIGH (ref 70–99)

## 2018-12-05 SURGERY — LIGATION, FALLOPIAN TUBE, LAPAROSCOPIC
Anesthesia: General | Site: Abdomen | Laterality: Bilateral

## 2018-12-05 MED ORDER — SUGAMMADEX SODIUM 500 MG/5ML IV SOLN
INTRAVENOUS | Status: DC | PRN
  Administered 2018-12-05: 500 mg via INTRAVENOUS

## 2018-12-05 MED ORDER — FENTANYL CITRATE (PF) 100 MCG/2ML IJ SOLN
25.0000 ug | INTRAMUSCULAR | Status: DC | PRN
  Administered 2018-12-05 (×2): 50 ug via INTRAVENOUS

## 2018-12-05 MED ORDER — FENTANYL CITRATE (PF) 100 MCG/2ML IJ SOLN
50.0000 ug | INTRAMUSCULAR | Status: DC | PRN
  Administered 2018-12-05: 100 ug via INTRAVENOUS

## 2018-12-05 MED ORDER — DEXAMETHASONE SODIUM PHOSPHATE 10 MG/ML IJ SOLN
INTRAMUSCULAR | Status: AC
  Filled 2018-12-05: qty 1

## 2018-12-05 MED ORDER — PROPOFOL 10 MG/ML IV BOLUS
INTRAVENOUS | Status: DC | PRN
  Administered 2018-12-05: 200 mg via INTRAVENOUS

## 2018-12-05 MED ORDER — LACTATED RINGERS IV SOLN
INTRAVENOUS | Status: DC
  Administered 2018-12-05 (×2): via INTRAVENOUS

## 2018-12-05 MED ORDER — KETOROLAC TROMETHAMINE 30 MG/ML IJ SOLN
INTRAMUSCULAR | Status: DC | PRN
  Administered 2018-12-05: 30 mg via INTRAVENOUS

## 2018-12-05 MED ORDER — FENTANYL CITRATE (PF) 100 MCG/2ML IJ SOLN
INTRAMUSCULAR | Status: AC
  Filled 2018-12-05: qty 2

## 2018-12-05 MED ORDER — ROCURONIUM BROMIDE 50 MG/5ML IV SOSY
PREFILLED_SYRINGE | INTRAVENOUS | Status: AC
  Filled 2018-12-05: qty 10

## 2018-12-05 MED ORDER — IBUPROFEN 600 MG PO TABS: 600.0000 mg | ORAL_TABLET | Freq: Four times a day (QID) | ORAL | 1 refills | Status: AC | PRN

## 2018-12-05 MED ORDER — SCOPOLAMINE 1 MG/3DAYS TD PT72
MEDICATED_PATCH | TRANSDERMAL | Status: AC
  Filled 2018-12-05: qty 1

## 2018-12-05 MED ORDER — SCOPOLAMINE 1 MG/3DAYS TD PT72
1.0000 | MEDICATED_PATCH | Freq: Once | TRANSDERMAL | Status: DC | PRN
  Administered 2018-12-05: 1.5 mg via TRANSDERMAL

## 2018-12-05 MED ORDER — SUGAMMADEX SODIUM 500 MG/5ML IV SOLN
INTRAVENOUS | Status: AC
  Filled 2018-12-05: qty 5

## 2018-12-05 MED ORDER — OXYCODONE-ACETAMINOPHEN 5-325 MG PO TABS: 1.0000 | ORAL_TABLET | Freq: Four times a day (QID) | ORAL | 0 refills | Status: DC | PRN

## 2018-12-05 MED ORDER — KETOROLAC TROMETHAMINE 60 MG/2ML IM SOLN
INTRAMUSCULAR | Status: DC | PRN
  Administered 2018-12-05: 30 mg via INTRAMUSCULAR

## 2018-12-05 MED ORDER — DEXAMETHASONE SODIUM PHOSPHATE 4 MG/ML IJ SOLN
INTRAMUSCULAR | Status: DC | PRN
  Administered 2018-12-05: 4 mg via INTRAVENOUS

## 2018-12-05 MED ORDER — ONDANSETRON HCL 4 MG/2ML IJ SOLN
INTRAMUSCULAR | Status: AC
  Filled 2018-12-05: qty 2

## 2018-12-05 MED ORDER — PROMETHAZINE HCL 25 MG/ML IJ SOLN: 6.2500 mg | INTRAMUSCULAR | Status: DC | PRN

## 2018-12-05 MED ORDER — SCOPOLAMINE 1 MG/3DAYS TD PT72: 1.0000 | MEDICATED_PATCH | TRANSDERMAL | Status: DC

## 2018-12-05 MED ORDER — LIDOCAINE HCL (CARDIAC) PF 100 MG/5ML IV SOSY
PREFILLED_SYRINGE | INTRAVENOUS | Status: DC | PRN
  Administered 2018-12-05: 80 mg via INTRAVENOUS

## 2018-12-05 MED ORDER — PROPOFOL 10 MG/ML IV BOLUS
INTRAVENOUS | Status: AC
  Filled 2018-12-05: qty 40

## 2018-12-05 MED ORDER — BUPIVACAINE HCL (PF) 0.5 % IJ SOLN
INTRAMUSCULAR | Status: AC
  Filled 2018-12-05: qty 30

## 2018-12-05 MED ORDER — BUPIVACAINE HCL (PF) 0.5 % IJ SOLN
INTRAMUSCULAR | Status: DC | PRN
  Administered 2018-12-05: 10 mL

## 2018-12-05 MED ORDER — PHENYLEPHRINE HCL 10 MG/ML IJ SOLN
INTRAMUSCULAR | Status: DC | PRN
  Administered 2018-12-05: 200 ug via INTRAVENOUS
  Administered 2018-12-05: 120 ug via INTRAVENOUS

## 2018-12-05 MED ORDER — MIDAZOLAM HCL 2 MG/2ML IJ SOLN
1.0000 mg | INTRAMUSCULAR | Status: DC | PRN
  Administered 2018-12-05: 1 mg via INTRAVENOUS

## 2018-12-05 MED ORDER — ROCURONIUM BROMIDE 100 MG/10ML IV SOLN
INTRAVENOUS | Status: DC | PRN
  Administered 2018-12-05: 50 mg via INTRAVENOUS

## 2018-12-05 MED ORDER — MIDAZOLAM HCL 2 MG/2ML IJ SOLN
INTRAMUSCULAR | Status: AC
  Filled 2018-12-05: qty 2

## 2018-12-05 SURGICAL SUPPLY — 32 items
BANDAGE ADH SHEER 1  50/CT (GAUZE/BANDAGES/DRESSINGS) IMPLANT
BRIEF STRETCH FOR OB PAD XXL (UNDERPADS AND DIAPERS) ×3 IMPLANT
CLIP FILSHIE TUBAL LIGA STRL (Clip) ×3 IMPLANT
DRSG OPSITE POSTOP 3X4 (GAUZE/BANDAGES/DRESSINGS) ×3 IMPLANT
DURAPREP 26ML APPLICATOR (WOUND CARE) ×3 IMPLANT
GLOVE BIO SURGEON STRL SZ 6.5 (GLOVE) ×2 IMPLANT
GLOVE BIO SURGEONS STRL SZ 6.5 (GLOVE) ×1
GLOVE BIOGEL PI IND STRL 7.0 (GLOVE) ×2 IMPLANT
GLOVE BIOGEL PI INDICATOR 7.0 (GLOVE) ×4
GOWN STRL REUS W/ TWL XL LVL3 (GOWN DISPOSABLE) ×1 IMPLANT
GOWN STRL REUS W/TWL LRG LVL3 (GOWN DISPOSABLE) ×3 IMPLANT
GOWN STRL REUS W/TWL XL LVL3 (GOWN DISPOSABLE) ×2
NEEDLE INSUFFLATION 120MM (ENDOMECHANICALS) ×3 IMPLANT
NS IRRIG 1000ML POUR BTL (IV SOLUTION) ×3 IMPLANT
PACK LAPAROSCOPY BASIN (CUSTOM PROCEDURE TRAY) ×3 IMPLANT
PACK TRENDGUARD 450 HYBRID PRO (MISCELLANEOUS) IMPLANT
PACK TRENDGUARD 600 HYBRD PROC (MISCELLANEOUS) IMPLANT
PAD OB MATERNITY 4.3X12.25 (PERSONAL CARE ITEMS) ×3 IMPLANT
PAD PREP 24X48 CUFFED NSTRL (MISCELLANEOUS) ×3 IMPLANT
SHEARS HARMONIC ACE PLUS 36CM (ENDOMECHANICALS) IMPLANT
SLEEVE SCD COMPRESS KNEE MED (MISCELLANEOUS) ×3 IMPLANT
SLEEVE XCEL OPT CAN 5 100 (ENDOMECHANICALS) IMPLANT
SUT VICRYL 0 UR6 27IN ABS (SUTURE) ×3 IMPLANT
SUT VICRYL 4-0 PS2 18IN ABS (SUTURE) ×3 IMPLANT
SYSTEM CARTER THOMASON II (TROCAR) IMPLANT
TOWEL GREEN STERILE FF (TOWEL DISPOSABLE) ×6 IMPLANT
TRENDGUARD 450 HYBRID PRO PACK (MISCELLANEOUS)
TRENDGUARD 600 HYBRID PROC PK (MISCELLANEOUS)
TROCAR OPTI TIP 5M 100M (ENDOMECHANICALS) IMPLANT
TROCAR XCEL DIL TIP R 11M (ENDOMECHANICALS) ×3 IMPLANT
TROCAR XCEL NON-BLD 11X100MML (ENDOMECHANICALS) ×3 IMPLANT
TUBING INSUFFLATION (TUBING) ×3 IMPLANT

## 2018-12-05 NOTE — Op Note (Signed)
12/05/2018  1:43 PM  PATIENT:  Cindy SitesLisa Webb  42 y.o. female  PRE-OPERATIVE DIAGNOSIS:  Undesired Fertility  POST-OPERATIVE DIAGNOSIS:  Undesired Fertility  PROCEDURE:  Procedure(s): LAPAROSCOPIC TUBAL LIGATION (Bilateral)  SURGEON:  Surgeon(s) and Role:    * Lafreda Casebeer C, MD - Primary  ANESTHESIA:   local and general  EBL: minimal  BLOOD ADMINISTERED:none  DRAINS: none   LOCAL MEDICATIONS USED:  MARCAINE     SPECIMEN:  No Specimen  DISPOSITION OF SPECIMEN:  N/A  COUNTS:  YES  TOURNIQUET:  * No tourniquets in log *  DICTATION: .Dragon Dictation  PLAN OF CARE: Discharge to home after PACU  PATIENT DISPOSITION:  PACU - hemodynamically stable.   Delay start of Pharmacological VTE agent (>24hrs) due to surgical blood loss or risk of bleeding: not applicable   The risks, benefits, alternatives of surgery were explained understood, accepted. All questions were answered. She understands the failure rate of 1/400. She declines alternative forms of birth control. Her urine pregnancy test was negative. She was taken to the operating room and general anesthesia was applied without complication. She was placed in dorsal lithotomy position. Her abdomen and vagina were prepped and draped in the usual sterile fashion. A time out procedure was done. A bimanual exam revealed a normal, size and shape mobile uterus with nonenlarged adnexa. A Hulka manipulator was placed on the cervix. Gloves were changed and attention was turned to the abdomen. Approximately 10 mL of 0.5% Marcaine was used to infiltrate the subcutaneous tissue in the left upper quadrant (due to her history of other laparoscopic procedures in her umbilicus. A vertical 1 cm incision was made. A Veres needle was placed in the pelvis. Low-flow CO2 was used to insufflate the abdomen to approximately 2 L. After good pneumoperitoneum was established, a 11 mm trocar (Optiview)  was placed. Laparoscopy confirmed correct placement. She  was placed in the Trendelenburg position. Patient abdominal pressure was always less than 15. Her uterus ovaries and tubes appeared normal. There was no evidence of endometriosis. There was a small omental adhesion at the umbilicus (no bowel involved) The oviducts were visually traced to their fimbriated end on each side. In the isthmic region of each oviduct, a Filshie clip was placed across the entire oviduct. Both ovaries looked normal.  There was no bleeding. The CO2 was allowed to escape from her abdomen. The fascia was closed with a figure of 0 Vicryl suture. No defects were palpable. The subcuticular closure was done with 4-0 Vicryl suture. The Hulka manipulator was removed. She was extubated and taken to recovery in stable condition. She tolerated the procedure well. The instrument, sponge, and needle counts were correct.

## 2018-12-05 NOTE — Anesthesia Preprocedure Evaluation (Addendum)
Anesthesia Evaluation  Patient identified by MRN, date of birth, ID band Patient awake    Reviewed: Allergy & Precautions, NPO status , Patient's Chart, lab work & pertinent test results  History of Anesthesia Complications Negative for: history of anesthetic complications  Airway Mallampati: II  TM Distance: >3 FB Neck ROM: Full    Dental no notable dental hx. (+) Dental Advisory Given   Pulmonary neg pulmonary ROS,    Pulmonary exam normal        Cardiovascular negative cardio ROS Normal cardiovascular exam     Neuro/Psych negative neurological ROS  negative psych ROS   GI/Hepatic negative GI ROS, Neg liver ROS,   Endo/Other  diabetesMorbid obesity  Renal/GU negative Renal ROS  negative genitourinary   Musculoskeletal negative musculoskeletal ROS (+)   Abdominal   Peds negative pediatric ROS (+)  Hematology negative hematology ROS (+)   Anesthesia Other Findings   Reproductive/Obstetrics negative OB ROS                            Anesthesia Physical Anesthesia Plan  ASA: III  Anesthesia Plan: General   Post-op Pain Management:    Induction: Intravenous  PONV Risk Score and Plan: 4 or greater and Ondansetron, Dexamethasone, Scopolamine patch - Pre-op and Diphenhydramine  Airway Management Planned: Oral ETT  Additional Equipment:   Intra-op Plan:   Post-operative Plan: Extubation in OR  Informed Consent: I have reviewed the patients History and Physical, chart, labs and discussed the procedure including the risks, benefits and alternatives for the proposed anesthesia with the patient or authorized representative who has indicated his/her understanding and acceptance.   Dental advisory given  Plan Discussed with: CRNA and Anesthesiologist  Anesthesia Plan Comments:        Anesthesia Quick Evaluation

## 2018-12-05 NOTE — Anesthesia Procedure Notes (Signed)
Procedure Name: Intubation Performed by: Verita Lamb, CRNA Pre-anesthesia Checklist: Emergency Drugs available, Patient identified, Suction available, Patient being monitored and Timeout performed Patient Re-evaluated:Patient Re-evaluated prior to induction Oxygen Delivery Method: Circle system utilized Preoxygenation: Pre-oxygenation with 100% oxygen Induction Type: IV induction Ventilation: Mask ventilation without difficulty Laryngoscope Size: Mac and 3 Grade View: Grade I Tube type: Oral Tube size: 7.0 mm Number of attempts: 1 Airway Equipment and Method: Stylet Placement Confirmation: ETT inserted through vocal cords under direct vision,  positive ETCO2,  breath sounds checked- equal and bilateral and CO2 detector Secured at: 24 cm Tube secured with: Tape Dental Injury: Teeth and Oropharynx as per pre-operative assessment

## 2018-12-05 NOTE — Transfer of Care (Signed)
Immediate Anesthesia Transfer of Care Note  Patient: Cindy SitesLisa Webb  Procedure(s) Performed: LAPAROSCOPIC TUBAL LIGATION (Bilateral Abdomen)  Patient Location: PACU  Anesthesia Type:General  Level of Consciousness: awake, alert  and oriented  Airway & Oxygen Therapy: Patient Spontanous Breathing and Patient connected to face mask oxygen  Post-op Assessment: Report given to RN and Post -op Vital signs reviewed and stable  Post vital signs: Reviewed and stable  Last Vitals:  Vitals Value Taken Time  BP 146/82 12/05/2018  1:45 PM  Temp    Pulse 100 12/05/2018  1:47 PM  Resp 14 12/05/2018  1:47 PM  SpO2 99 % 12/05/2018  1:47 PM  Vitals shown include unvalidated device data.  Last Pain:  Vitals:   12/05/18 1139  TempSrc: Oral  PainSc: 0-No pain      Patients Stated Pain Goal: 3 (12/05/18 1139)  Complications: No apparent anesthesia complications

## 2018-12-05 NOTE — H&P (Signed)
Subjective:    Patient is a 42 y.o. female scheduled for laparoscopic application of Filsche clips. Indication for procedure is unwanted fertility  Pertinent Gynecological History: She has a h/o DUB and the work up has been all normal. She is currently amenorrheic with depo provera, injection last given about a month ago.    Menstrual History: OB History    Gravida  1   Para  1   Term  0   Preterm  1   AB  0   Living  1     SAB  0   TAB  0   Ectopic  0   Multiple  0   Live Births  1            Patient's last menstrual period was 09/28/2018 (within weeks).    The following portions of the patient's history were reviewed and updated as appropriate: allergies, current medications, past family history, past medical history, past social history, past surgical history and problem list.  Review of Systems Pertinent items are noted in HPI.    Objective:    BP 126/74   Pulse 85   Temp 98.4 F (36.9 C) (Oral)   Resp 16   Ht 5\' 2"  (1.575 m)   Wt 87 kg   LMP 09/28/2018 (Within Weeks) Comment: urine pregnancy test negative 12/04/18  SpO2 99%   BMI 35.08 kg/m   General:   alert  Skin:   normal  HEENT:  PERRLA  Lungs:   clear to auscultation bilaterally  Heart:   regular rate and rhythm, S1, S2 normal, no murmur, click, rub or gallop  Breasts:   normal without suspicious masses, skin or nipple changes or axillary nodes and self-exam is taught and encouraged  Abdomen:  soft, non-tender; bowel sounds normal; no masses,  no organomegaly  Pelvis:  Exam deferred.                                Assessment:    Unwanted fertility, declines alternative forms of contraception She understands the failure rate of about 1 in 400 women.  She understands the risks of surgery, including, but not to infection, bleeding, DVTs, damage to bowel, bladder, ureters. She wishes to proceed.      Plan:    Plan for laparoscopic application of Filsche clips.

## 2018-12-05 NOTE — Progress Notes (Signed)
Dr Krista BlueSinger has reviewed EKG from 12/04/18 and surgery to proceed as scheduled.

## 2018-12-05 NOTE — Discharge Instructions (Signed)

## 2018-12-06 ENCOUNTER — Encounter (HOSPITAL_BASED_OUTPATIENT_CLINIC_OR_DEPARTMENT_OTHER): Payer: Self-pay | Admitting: Obstetrics & Gynecology

## 2018-12-06 NOTE — Anesthesia Postprocedure Evaluation (Signed)
Anesthesia Post Note  Patient: Cindy SitesLisa Webb  Procedure(s) Performed: LAPAROSCOPIC TUBAL LIGATION (Bilateral Abdomen)     Anesthesia Type: General    Last Vitals:  Vitals:   12/05/18 1415 12/05/18 1430  BP:  111/69  Pulse: 85 73  Resp: 18 18  Temp:  36.8 C  SpO2: 97% 97%    Last Pain:  Vitals:   12/05/18 1430  TempSrc:   PainSc: 2                  Dahl Higinbotham DANIEL

## 2019-01-14 ENCOUNTER — Encounter: Payer: Medicaid Other | Admitting: Obstetrics & Gynecology

## 2019-01-17 ENCOUNTER — Encounter: Payer: Self-pay | Admitting: Obstetrics & Gynecology

## 2019-01-17 ENCOUNTER — Ambulatory Visit (INDEPENDENT_AMBULATORY_CARE_PROVIDER_SITE_OTHER): Payer: Medicaid Other | Admitting: Obstetrics & Gynecology

## 2019-01-17 ENCOUNTER — Other Ambulatory Visit (HOSPITAL_COMMUNITY)
Admission: RE | Admit: 2019-01-17 | Discharge: 2019-01-17 | Disposition: A | Discharge status: Home / Self Care | Payer: Medicaid Other | Source: Ambulatory Visit | Attending: Obstetrics & Gynecology | Admitting: Obstetrics & Gynecology

## 2019-01-17 VITALS — BP 136/86 | HR 90 | Ht 62.0 in | Wt 198.0 lb

## 2019-01-17 DIAGNOSIS — N898 Other specified noninflammatory disorders of vagina: Secondary | ICD-10-CM | POA: Insufficient documentation

## 2019-01-17 DIAGNOSIS — N870 Mild cervical dysplasia: Secondary | ICD-10-CM

## 2019-01-17 DIAGNOSIS — N939 Abnormal uterine and vaginal bleeding, unspecified: Secondary | ICD-10-CM | POA: Diagnosis not present

## 2019-01-17 NOTE — Progress Notes (Signed)
   Subjective:    Patient ID: Cindy Webb, female    DOB: [birth year], 43 y.o.   MRN: [id number]  HPI  Pt c/o bleeding.  Every other week, mostly spotting.  Sees it when she wipes.  Last depo was November 6th 2019.  Pt   Review of Systems     Objective:   Physical Exam        Assessment & Plan:

## 2019-01-17 NOTE — Progress Notes (Signed)
   Subjective:    Patient ID: Cindy Webb, female    DOB: 06-Sep-1976, 43 y.o.   MRN: [id number]  HPI  43 year old female presents for spotting.  Patient had Depo-Provera on 10/24/2018 team.  Patient had subsequent tubal ligation.  Patient does not have any complications or problems from the tubal.  She continues to spot.  The Depo-Provera is just starting to get out of her system.  I believe the bleeding could still be attributed to the Depo-Provera.  Patient does not have any pain associated with the bleeding.  It is very light and does not require tampons or pads.  Her ultrasound late last year showed a normal uterus with a 4 mm lining.  Endometrial biopsy was also negative.  Patient complaining of vaginal odor and she wonders she had bacterial vaginosis.  Patient has had BV in the past.  Review of Systems  Constitutional: Negative.   Respiratory: Negative.   Cardiovascular: Negative.   Gastrointestinal: Negative.   Genitourinary: Negative.        Objective:   Physical Exam Vitals signs reviewed.  Constitutional:      General: She is not in acute distress.    Appearance: She is well-developed.  HENT:     Head: Normocephalic and atraumatic.  Eyes:     Conjunctiva/sclera: Conjunctivae normal.  Cardiovascular:     Rate and Rhythm: Normal rate.  Pulmonary:     Effort: Pulmonary effort is normal.  Abdominal:     General: There is no distension.     Palpations: Abdomen is soft.     Tenderness: There is no abdominal tenderness.  Genitourinary:    General: Normal vulva.     Vagina: No vaginal discharge.     Comments: Trace blood coming from cervical office.  No polyp or lesion. Skin:    General: Skin is warm and dry.  Neurological:     Mental Status: She is alert and oriented to person, place, and time.     Assessment & Plan:  43 year old female status post laparoscopic BTL with Filshie clips doing well.    1.  Vaginal odor__we will test for vaginitis. 2.  Vaginal  spotting--patient still under the influence of Deppe.  I suggest we wait 2 months before intervening.  Patient comfortable with this idea.  If it becomes unbearable patient can make appointment sooner.  If not, we will see patient in 2 months. 3.  All reviewing history, I believe patient needs colposcopy.  In August 2018 she had ASCUS with HPV positive.  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  Follow-up Pap smear in October 2019 showed normal cytology with positive HPV.  According to ASCCP guidelines cotesting 1 year after CIN-1 biopsy with a result of ASCUS or HPV positive should result in colposcopy.

## 2019-01-17 NOTE — Progress Notes (Signed)
Pt has no complaints about surgery Pt c/o heavy bleeding

## 2019-01-21 LAB — CERVICOVAGINAL ANCILLARY ONLY
Bacterial vaginitis: NEGATIVE
CANDIDA VAGINITIS: NEGATIVE
Chlamydia: NEGATIVE
Neisseria Gonorrhea: NEGATIVE
Trichomonas: NEGATIVE

## 2019-01-28 ENCOUNTER — Ambulatory Visit: Payer: Medicaid Other | Admitting: Obstetrics & Gynecology

## 2019-01-28 ENCOUNTER — Encounter: Payer: Self-pay | Admitting: Obstetrics & Gynecology

## 2019-01-28 ENCOUNTER — Ambulatory Visit (INDEPENDENT_AMBULATORY_CARE_PROVIDER_SITE_OTHER): Payer: Medicaid Other

## 2019-01-28 VITALS — BP 128/75 | HR 80 | Resp 16 | Ht 62.0 in | Wt 195.0 lb

## 2019-01-28 DIAGNOSIS — R87612 Low grade squamous intraepithelial lesion on cytologic smear of cervix (LGSIL): Secondary | ICD-10-CM | POA: Diagnosis not present

## 2019-01-28 DIAGNOSIS — Z3202 Encounter for pregnancy test, result negative: Secondary | ICD-10-CM

## 2019-01-28 DIAGNOSIS — R102 Pelvic and perineal pain: Secondary | ICD-10-CM | POA: Diagnosis not present

## 2019-01-28 DIAGNOSIS — N939 Abnormal uterine and vaginal bleeding, unspecified: Secondary | ICD-10-CM | POA: Diagnosis not present

## 2019-01-28 LAB — POCT URINE PREGNANCY: Preg Test, Ur: NEGATIVE

## 2019-01-28 NOTE — Progress Notes (Signed)
   Subjective:    Patient ID: Cindy SitesLisa Webb, female    DOB: [birth year], 43 y.o.   MRN: [id number]  HPI  43 year old female presents for colposcopy as well as complaining of continued spotting.  Patient did get Deppe back in November and the Depakote could still be in her system.  Patient also complaining of vague ovarian pain.  Although low yield I think repeat ultrasound will help evaluate the endometrium as well as her ovary pain.  She is also using Megace periodically.  I suggested she stop all hormones and let her body reset.  Review of Systems  Genitourinary: Positive for menstrual problem, pelvic pain and vaginal bleeding.       Objective:   Physical Exam Vitals signs reviewed.  Constitutional:      General: She is not in acute distress.    Appearance: She is well-developed.  HENT:     Head: Normocephalic and atraumatic.  Eyes:     Conjunctiva/sclera: Conjunctivae normal.  Cardiovascular:     Rate and Rhythm: Normal rate.  Pulmonary:     Effort: Pulmonary effort is normal.  Genitourinary:    Comments: Brownish blood scant coming from us. See colposcopy note. Skin:    General: Skin is warm and dry.  Neurological:     Mental Status: She is alert and oriented to person, place, and time.    Vitals:   01/28/19 1411  BP: 128/75  Pulse: 80  Resp: 16  Weight: 195 lb (88.5 kg)  Height: 5\' 2"  (1.575 m)    Assessment & Plan:  43 year old female presents for spotting and colposcopy  1.  Repeat ultrasound for reassurance. 2.  Colposcopy note 3.  Stop Megace.   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  F/U Pap smear 4 months ago showed: nml cytology and HPV+.   Procedure Details  The risks and benefits of the procedure and Written informed consent obtained.  Speculum placed in vagina and excellent visualization of cervix achieved, cervix swabbed x 3 with acetic acid solution.  Findings: Cervix: no visible lesions; aceti acid placed,  cervix swabbed with Lugol's solution, and endocervical curettage performed. Vaginal inspection: vaginal colposcopy not performed. Vulvar colposcopy: vulvar colposcopy not performed.  Specimens: ECC  Complications: none.  Plan: Specimens labelled and sent to Pathology. Will base further treatment on Pathology findings. Post biopsy instructions given to patient.

## 2019-02-06 ENCOUNTER — Telehealth: Payer: Self-pay | Admitting: *Deleted

## 2019-02-06 NOTE — Telephone Encounter (Signed)
-----   Message from Lesly Dukes, MD sent at 02/03/2019  7:09 PM EST ----- ECC negative.  RN to call with results.  Will need pa and HPV in 1 year.  Encourage to sign up on my chart.

## 2019-02-06 NOTE — Telephone Encounter (Signed)
Pt notified of neg ECC and that she just needs to make sure that she has a pap and cotesting in 1 year.  Explained to patient that if she signed up on my chart she could communicate easier with the office and provider.

## 2019-03-18 ENCOUNTER — Ambulatory Visit: Payer: Medicaid Other | Admitting: Obstetrics & Gynecology

## 2019-06-06 ENCOUNTER — Telehealth: Payer: Self-pay

## 2019-06-06 NOTE — Telephone Encounter (Signed)
Attempted to call pt to reschedule follow up appt that was cancelled due to covid. No answer and unable to leave VM.

## 2020-09-01 IMAGING — US US PELVIS COMPLETE TRANSABD/TRANSVAG
1 series · 14 of 25 positions shown · non-contrast
Comparison: None

CLINICAL DATA: Vaginal bleeding x3 weeks, on Depo shot

EXAM:
TRANSABDOMINAL AND TRANSVAGINAL ULTRASOUND OF PELVIS
TECHNIQUE: Both transabdominal and transvaginal ultrasound examinations of the
pelvis were performed. Transabdominal technique was performed for
global imaging of the pelvis including uterus, ovaries, adnexal
regions, and pelvic cul-de-sac. It was necessary to proceed with
endovaginal exam following the transabdominal exam to visualize the
endometrium and bilateral ovaries.

[Series 1: us pelvis complete transabd/transvag · 0.19mm/px · 14 of 50 slices shown]
[im 1/50]
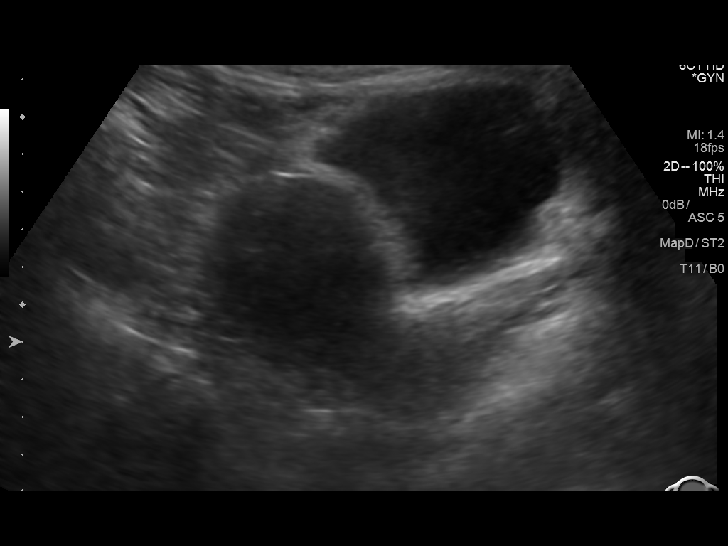
[im 5/50]
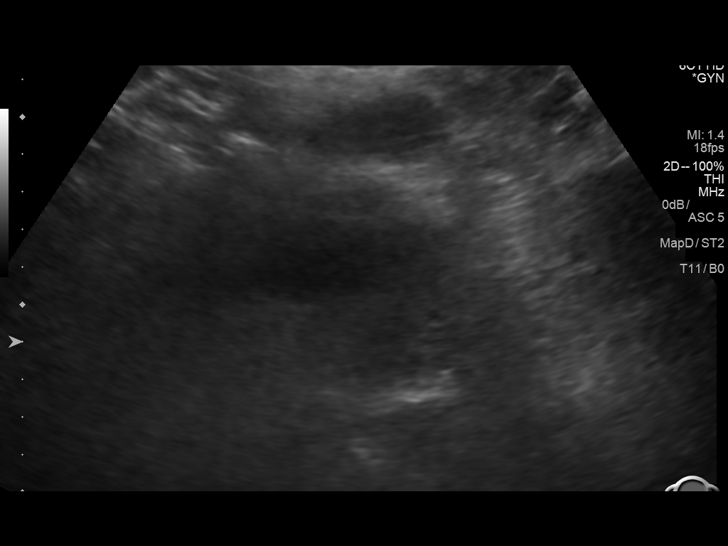
[im 9/50]
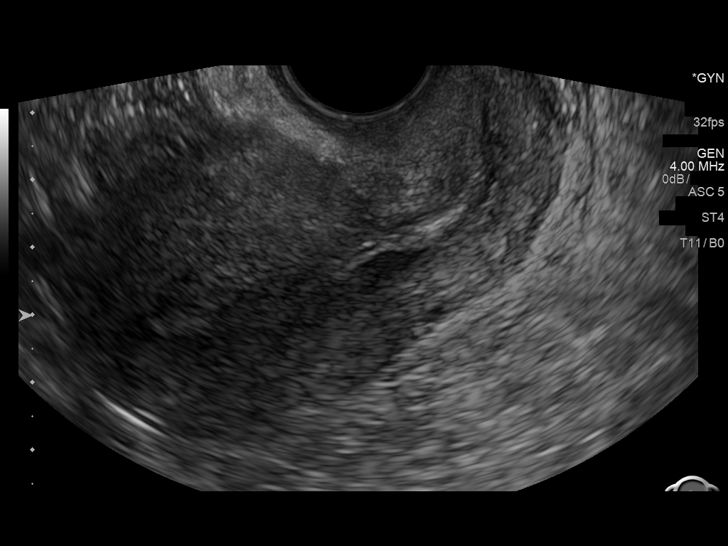
[im 13/50]
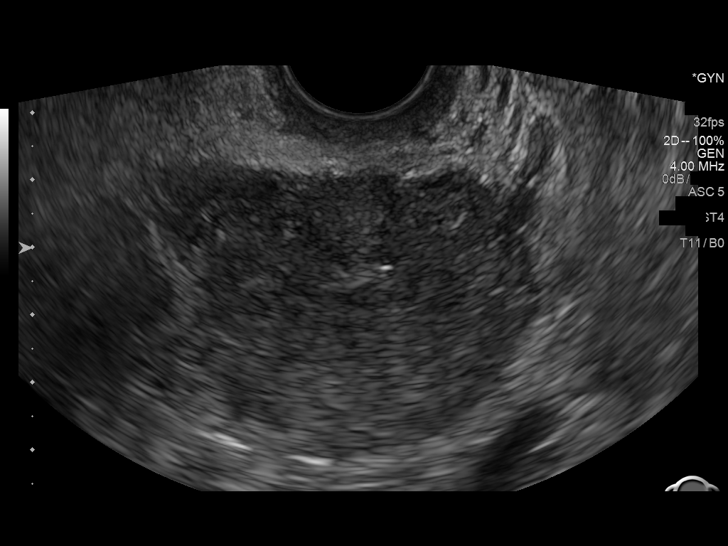
[im 17/50]
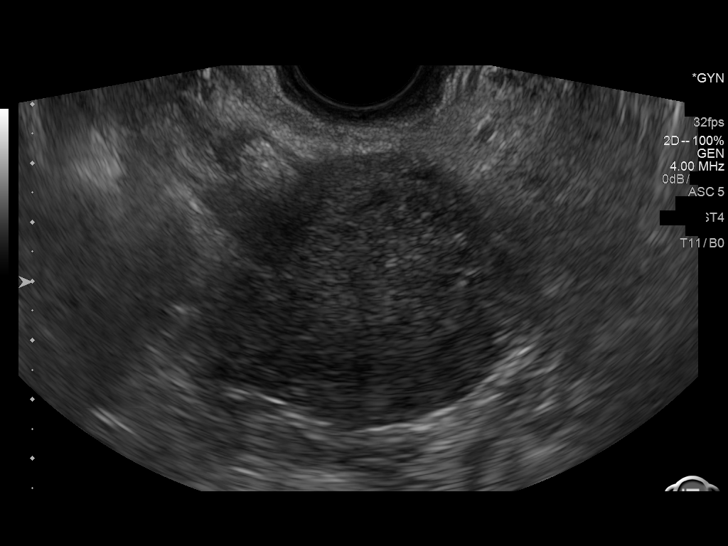
[im 19/50]
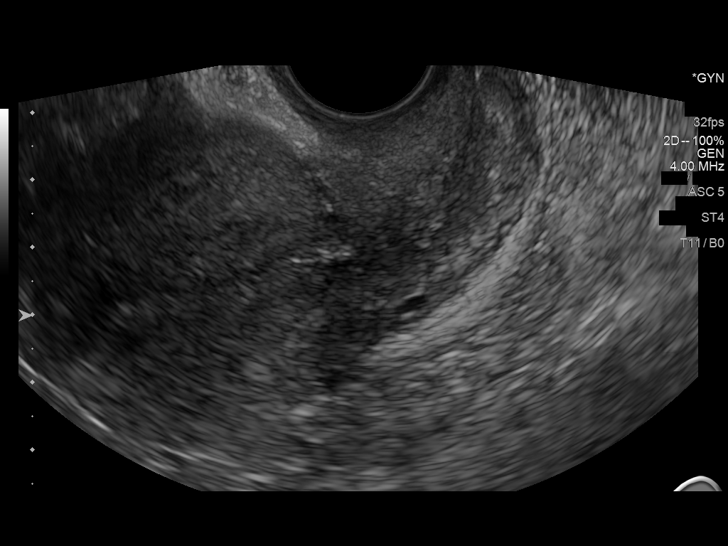
[im 23/50]
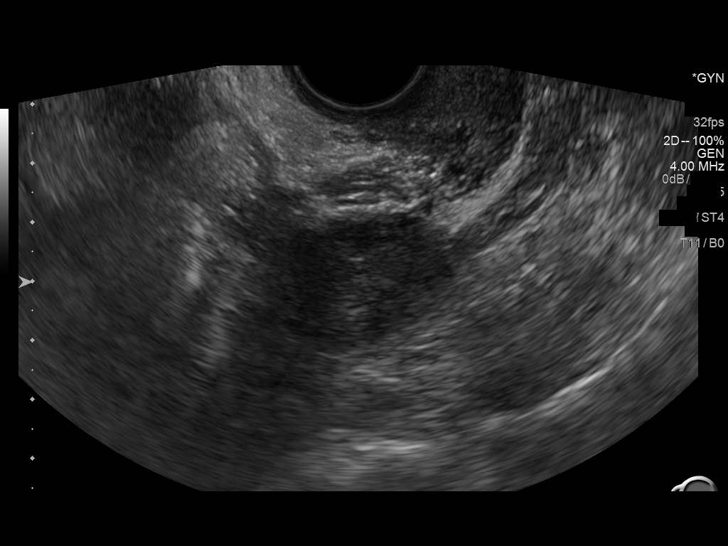
[im 27/50]
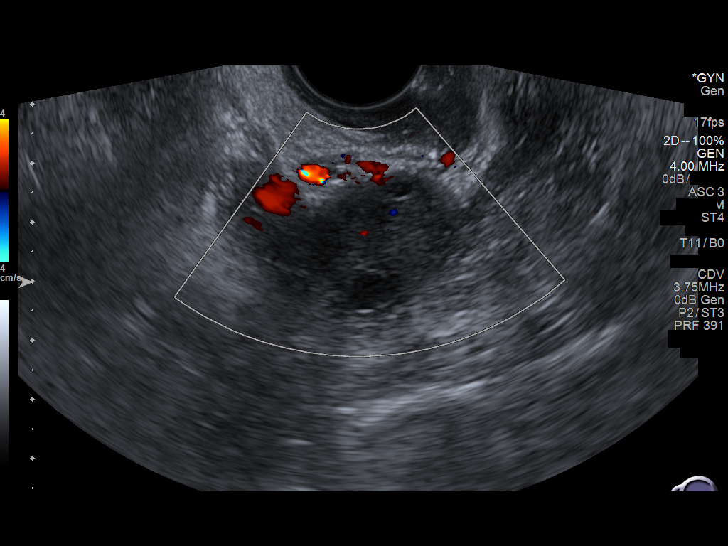
[im 31/50]
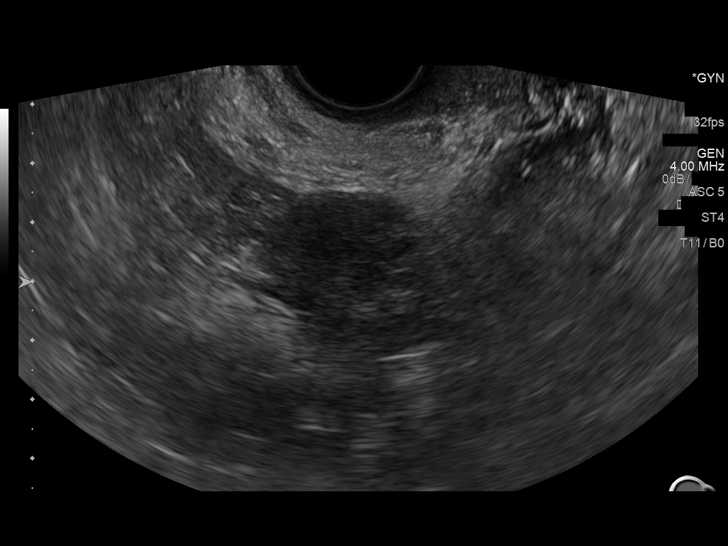
[im 33/50]
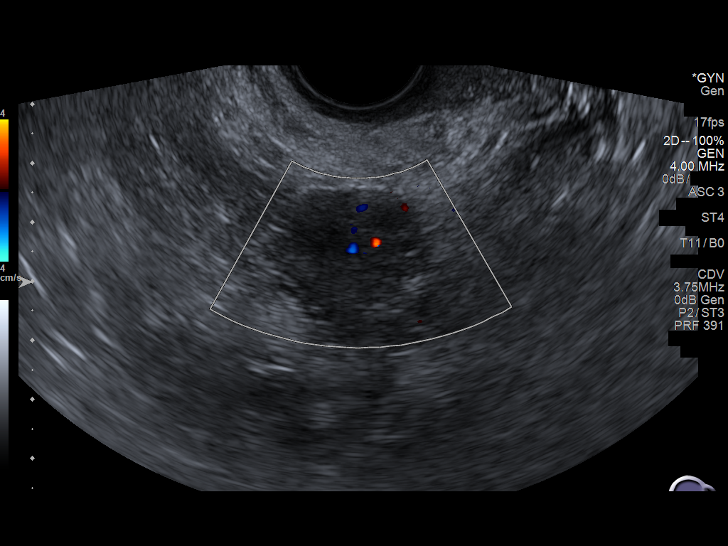
[im 37/50]
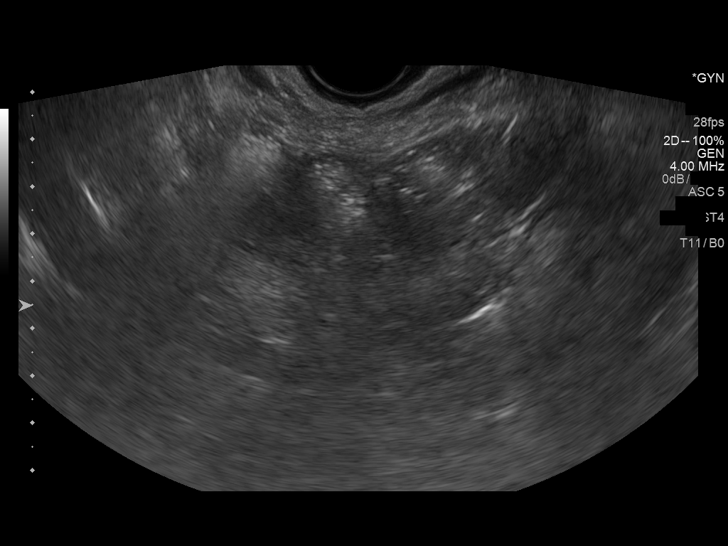
[im 41/50]
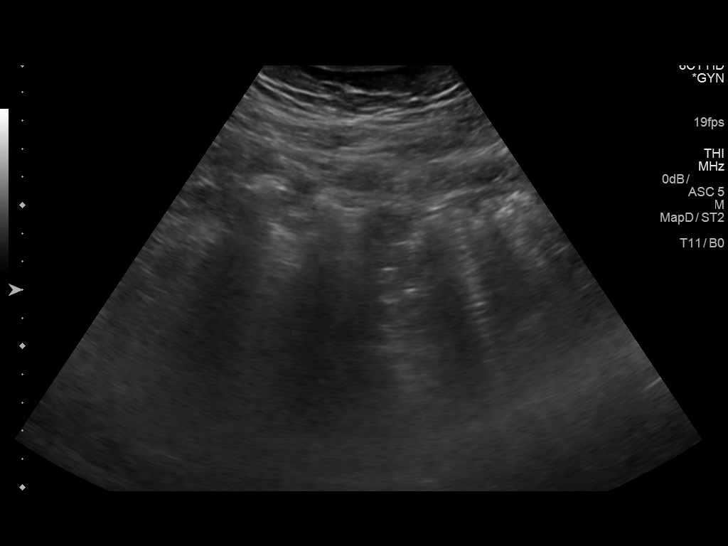
[im 45/50]
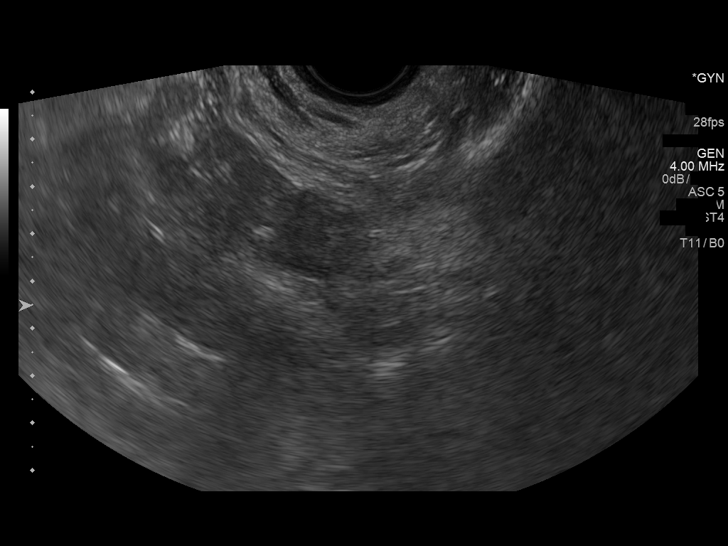
[im 50/50]
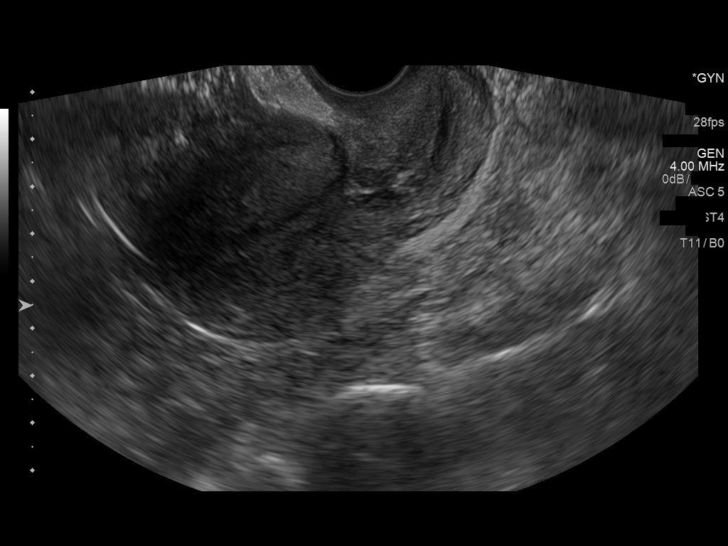

[14 of 25 positions shown; findings below may reference images not displayed]

FINDINGS: Uterus

Measurements: 8.1 x 4.4 x 6.3 cm. No fibroids or other mass
visualized.

Endometrium

Thickness: 4 mm.  No focal abnormality visualized.

Right ovary

Measurements: 3.0 x 2.4 x 2.0 cm. Normal appearance/no adnexal mass.

Left ovary

Measurements: 3.2 x 2.5 x 2.6.  Normal appearance/no adnexal mass.

Other findings

No abnormal free fluid.
IMPRESSION: Negative pelvic ultrasound.

## 2021-01-03 IMAGING — US US TRANSVAGINAL NON-OB
1 series · 14 of 25 positions shown · non-contrast
Comparison: 09/26/2018

CLINICAL DATA: Chronic abnormal uterine bleeding. Abnormal Pap
smear.

EXAM:
ULTRASOUND PELVIS TRANSVAGINAL
TECHNIQUE: Transvaginal ultrasound examination of the pelvis was performed
including evaluation of the uterus, ovaries, adnexal regions, and
pelvic cul-de-sac.

[Series 1: us transvaginal non-ob · 0.12mm/px · 14 of 69 slices shown]
[im 1/69]
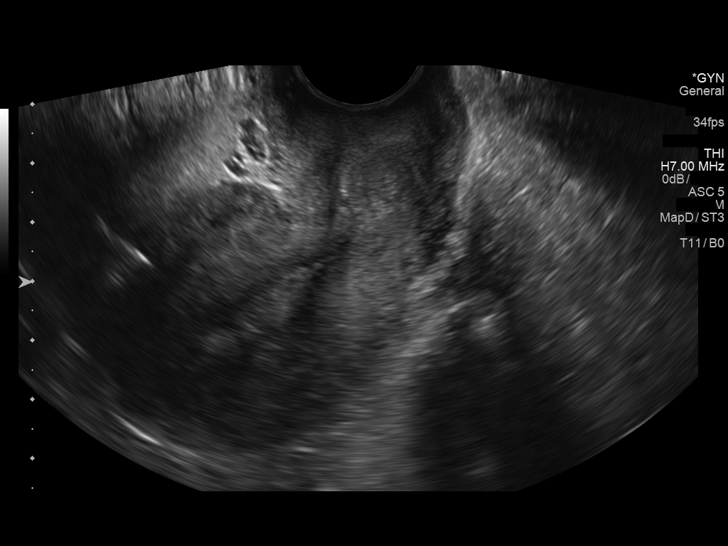
[im 6/69]
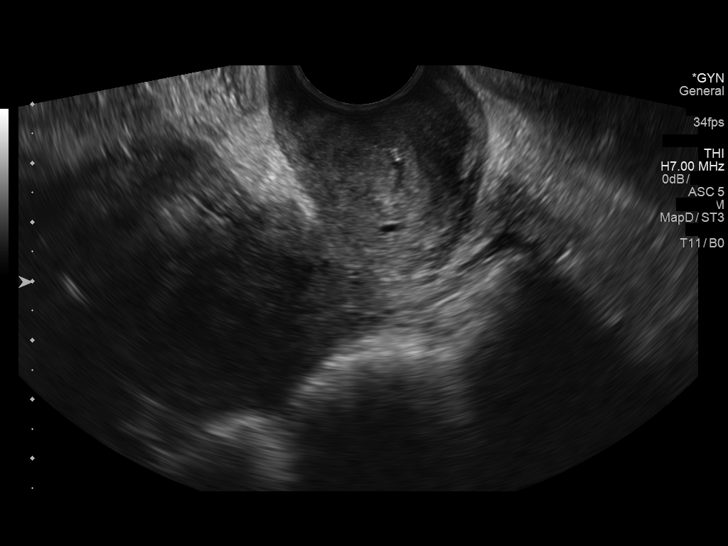
[im 12/69]
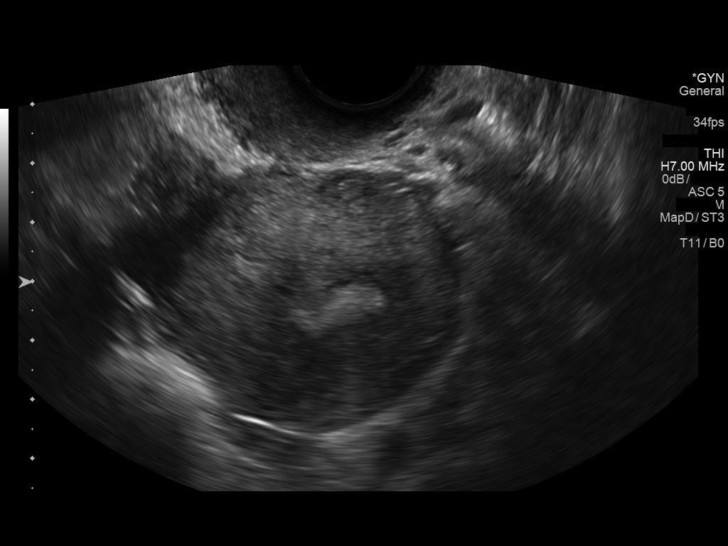
[im 18/69]
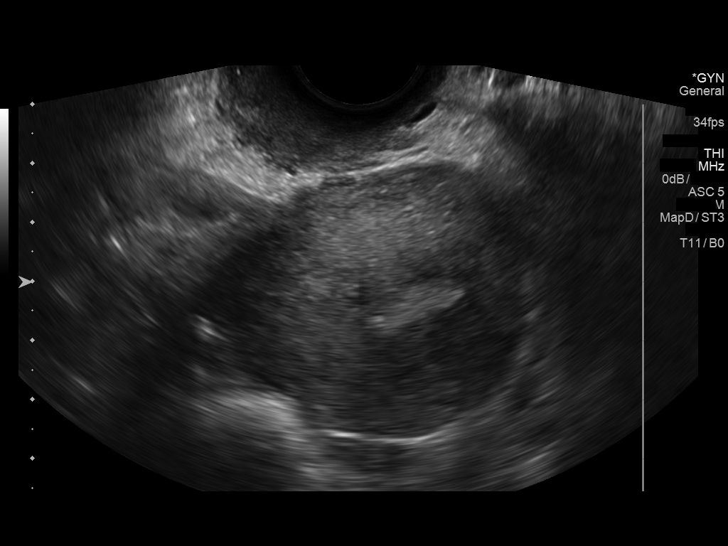
[im 23/69]
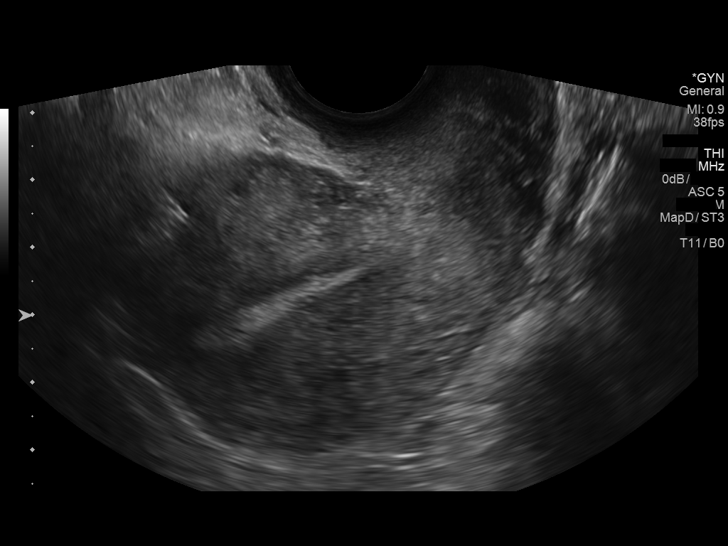
[im 26/69]
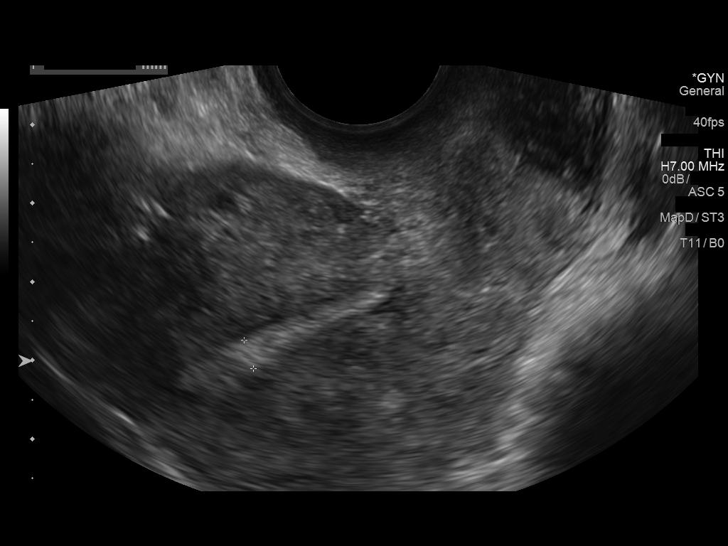
[im 32/69]
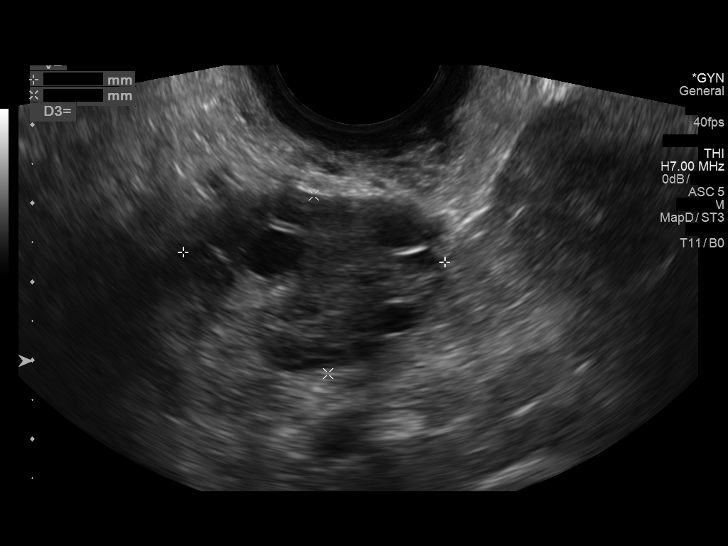
[im 37/69]
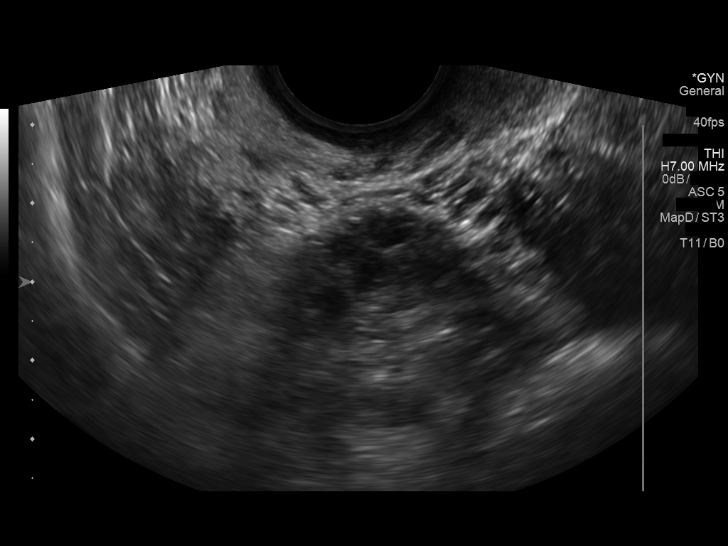
[im 43/69]
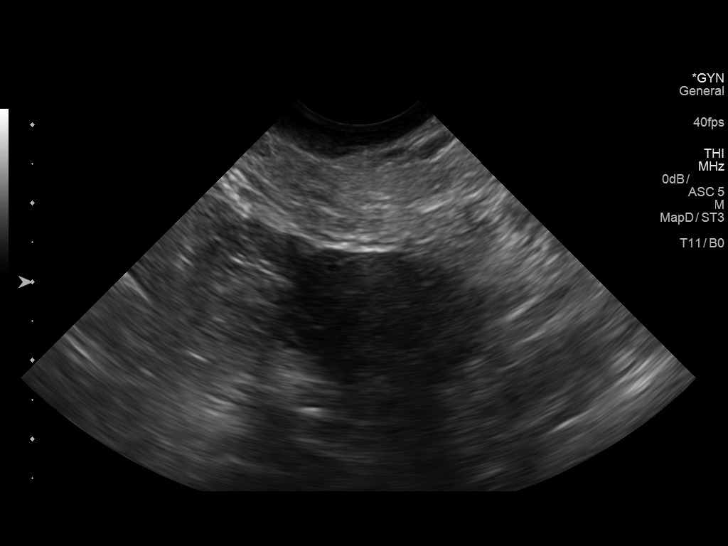
[im 46/69]
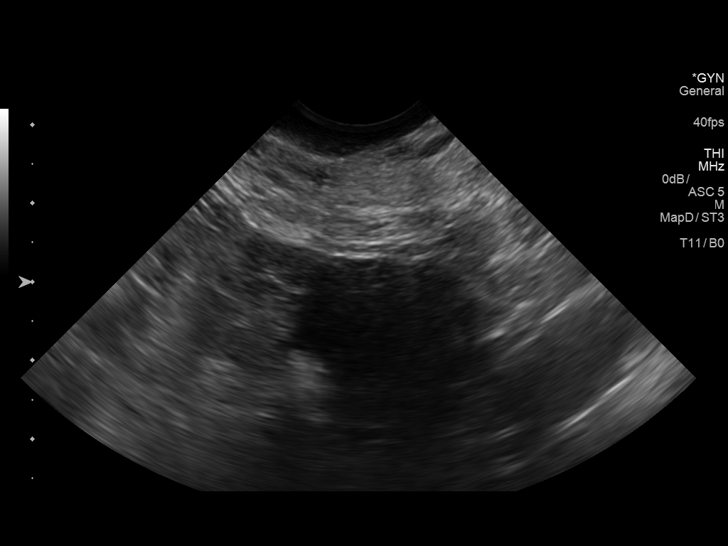
[im 52/69]
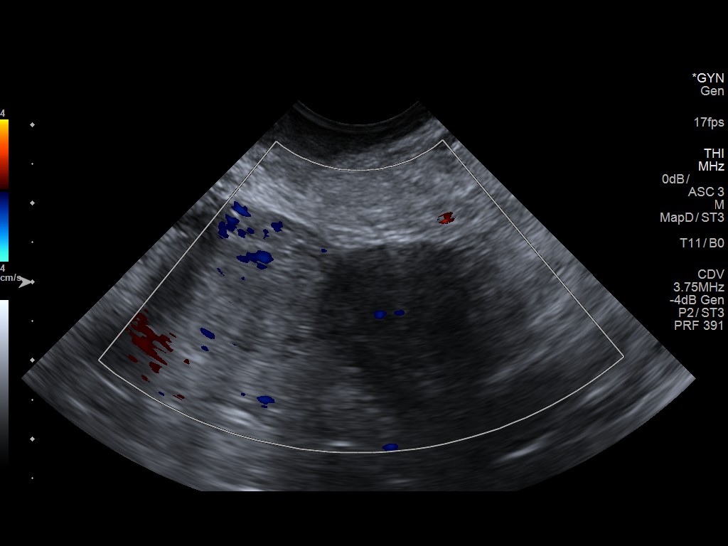
[im 57/69]
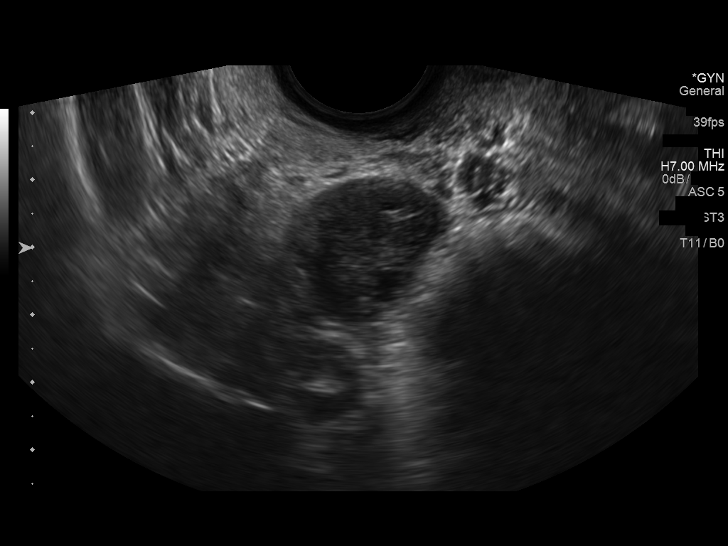
[im 63/69]
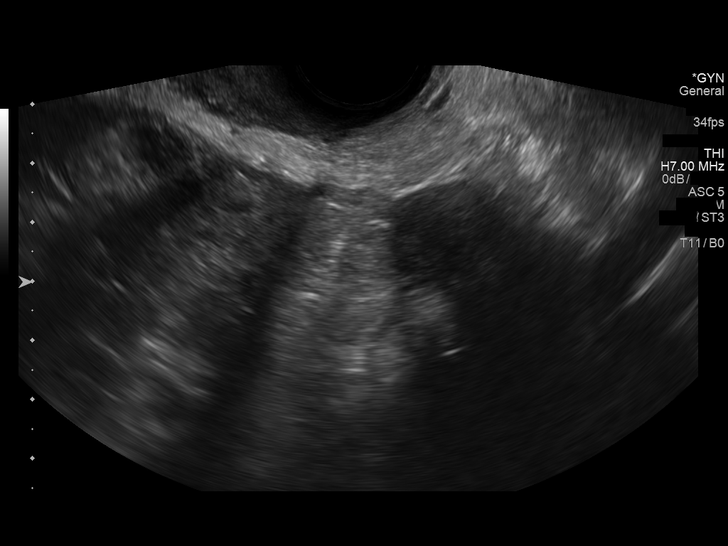
[im 69/69]
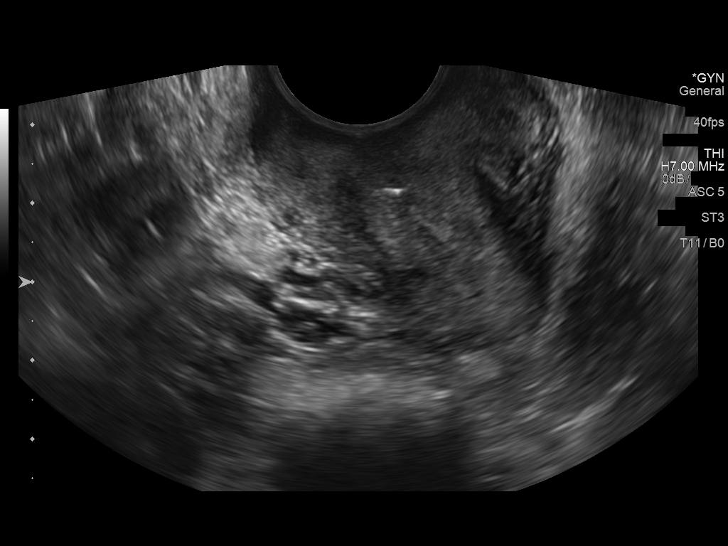

[14 of 25 positions shown; findings below may reference images not displayed]

FINDINGS: Uterus

Measurements: 6.7 x 4.5 x 5.2 cm = volume: 82 mL. No fibroids or
other mass visualized. Prior C-section scar noted.

Endometrium

Thickness: 4 mm.  No focal abnormality visualized.

Right ovary

Measurements: 3.3 x 2.3 x 2.6 cm = volume: 11 mL. Normal
appearance/no adnexal mass.

Left ovary

Measurements: 2.6 x 1.9 x 2.1 cm = volume: 5 mL. Normal
appearance/no adnexal mass.

Other findings:  No abnormal free fluid
IMPRESSION: Endometrial thickness measures 4 mm. No fibroids identified. If
bleeding remains unresponsive to hormonal or medical therapy,
sonohysterogram should be considered for focal lesion work-up. (Ref:
Radiological Reasoning: Algorithmic Workup of Abnormal Vaginal
Bleeding with Endovaginal Sonography and Sonohysterography. AJR
[id number]; 191:[id number]).

Normal appearance of both ovaries.
